# Patient Record
Sex: Male | Born: 1980 | Race: White | Hispanic: Yes | Marital: Married | Smoking: Never smoker
Health system: Southern US, Community
[De-identification: ages and names within clinical notes are randomized; demographics above are authoritative.]

## PROBLEM LIST (undated history)

## (undated) DIAGNOSIS — N5082 Scrotal pain: Secondary | ICD-10-CM

## (undated) DIAGNOSIS — K5792 Diverticulitis of intestine, part unspecified, without perforation or abscess without bleeding: Secondary | ICD-10-CM

## (undated) HISTORY — DX: Scrotal pain: N50.82

## (undated) HISTORY — DX: Diverticulitis of intestine, part unspecified, without perforation or abscess without bleeding: K57.92

## (undated) HISTORY — PX: BACK SURGERY: SHX140

---

## 2014-09-19 ENCOUNTER — Ambulatory Visit
Admission: EM | Admit: 2014-09-19 | Discharge: 2014-09-19 | Disposition: A | Discharge status: Home / Self Care | Payer: Worker's Compensation | Attending: Family Medicine | Admitting: Family Medicine

## 2014-09-19 ENCOUNTER — Ambulatory Visit: Payer: Worker's Compensation

## 2014-09-19 DIAGNOSIS — M25552 Pain in left hip: Secondary | ICD-10-CM | POA: Insufficient documentation

## 2014-09-19 MED ORDER — CYCLOBENZAPRINE HCL 5 MG PO TABS: 5.0000 mg | ORAL_TABLET | Freq: Three times a day (TID) | ORAL | Status: DC | PRN

## 2014-09-19 MED ORDER — KETOROLAC TROMETHAMINE 60 MG/2ML IM SOLN
60.0000 mg | Freq: Once | INTRAMUSCULAR | Status: AC
  Administered 2014-09-19: 60 mg via INTRAMUSCULAR

## 2014-09-19 MED ORDER — IBUPROFEN 800 MG PO TABS: 800.0000 mg | ORAL_TABLET | Freq: Three times a day (TID) | ORAL | Status: DC

## 2014-09-19 NOTE — ED Notes (Signed)
Backhoe hit left posterior upper buttock. C/o pain laterally down left leg to knee

## 2014-09-19 NOTE — ED Provider Notes (Signed)
CSN: [id number]     Arrival date & time 09/19/14  1443 History   First MD Initiated Contact with Patient 09/19/14 1713     Chief Complaint  Patient presents with  . Back Pain  . 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  Supervisor with him.  Denied bruising, swelling, cuts, incontinence, leg weakness.  Frequently changing position to find comfort e.g. Sitting, standing, shifting weight.  Most pain with weight bearing left hip and radiates to left knee.  Patient is a 34 y.o. male presenting with back pain and leg pain. The history is provided by the patient and a friend. The history is limited by a language barrier. No language interpreter was used.  Back Pain Location:  Sacro-iliac joint Quality:  Shooting Radiates to:  L knee Pain severity:  Severe Pain is:  Same all the time Onset quality:  Sudden Duration:  3 hours Timing:  Constant Progression:  Unchanged Context: occupational injury and recent injury   Context: not emotional stress, not falling, not jumping from heights, not lifting heavy objects, not MCA, not MVA, not pedestrian accident, not physical stress, not recent illness and not twisting   Relieved by:  Being still Worsened by:  Twisting, bending, ambulation, movement, standing, touching and sitting Ineffective treatments:  Being still Associated symptoms: leg pain   Associated symptoms: no abdominal pain, no abdominal swelling, no bladder incontinence, no bowel incontinence, no chest pain, no dysuria, no fever, no headaches, no numbness, no paresthesias, no pelvic pain, no perianal numbness, no tingling, no weakness and no weight loss   Risk factors: no hx of cancer, no hx of osteoporosis, no lack of exercise, no menopause, not  obese, not pregnant, no recent surgery, no steroid use and no vascular disease   Leg Pain Associated symptoms: back pain   Associated symptoms: no fatigue, no fever and no neck pain     History reviewed. No pertinent past medical history. History reviewed. No pertinent past surgical history. History reviewed. No pertinent family history. History  Substance Use Topics  . Smoking status: Never Smoker   . Smokeless tobacco: Not on file  . Alcohol Use: No    Review of Systems  Constitutional: Negative for fever, chills, weight loss, diaphoresis, activity change, appetite change and fatigue.  HENT: Negative for ear discharge, ear pain and rhinorrhea.   Eyes: Negative for photophobia, pain, discharge, redness, itching and visual disturbance.  Respiratory: Negative for cough, shortness of breath and wheezing.   Cardiovascular: Negative for chest pain, palpitations and leg swelling.  Gastrointestinal: Negative for nausea, abdominal pain, diarrhea, constipation and bowel incontinence.  Genitourinary: Negative for bladder incontinence, dysuria, difficulty urinating and pelvic pain.  Musculoskeletal: Positive for myalgias, back pain, arthralgias and gait problem. Negative for joint swelling, neck pain and neck stiffness.  Skin: Negative for color change, pallor, rash and wound.  Allergic/Immunologic: Negative for environmental allergies and food allergies.  Neurological: Negative for tingling, weakness, numbness, headaches and paresthesias.  Hematological: Negative for adenopathy. Does not bruise/bleed easily.  Psychiatric/Behavioral: Negative for behavioral problems, confusion and agitation. The patient is not nervous/anxious and is not hyperactive.     Allergies  Review of patient's allergies indicates no known allergies.  Home Medications   Prior to Admission medications   Medication Sig Start Date End Date Taking? Authorizing Provider  cyclobenzaprine (FLEXERIL) 5 MG tablet Take 1  tablet (5 mg total) by mouth 3 (three) times daily as needed for muscle spasms. 09/19/14   Barbaraann Barthelina A Raylei Losurdo, NP  ibuprofen (ADVIL,MOTRIN) 800 MG tablet Take 1 tablet (800 mg total) by mouth 3 (three) times daily. 09/19/14   Jarold Songina A Danielle Mink, NP   BP 114/71 mmHg  Pulse 68  Temp(Src) 97.2 F (36.2 C) (Tympanic)  Resp 16  Ht 5\' 5"  (1.651 m)  Wt 170 lb (77.111 kg)  BMI 28.29 kg/m2  SpO2 98% Physical Exam  Constitutional: He is oriented to person, place, and time. Vital signs are normal. He appears well-developed and well-nourished. No distress.  HENT:  Head: Normocephalic and atraumatic.  Mouth/Throat: Oropharynx is clear and moist.  Eyes: Conjunctivae, EOM and lids are normal. Pupils are equal, round, and reactive to light. Right eye exhibits no discharge. Left eye exhibits no discharge. No scleral icterus.  Neck: Trachea normal and normal range of motion. Neck supple. No JVD present. No tracheal deviation present. No thyromegaly present.  Cardiovascular: Normal rate, regular rhythm, normal heart sounds and intact distal pulses.   Pulmonary/Chest: Effort normal and breath sounds normal. No respiratory distress. He has no wheezes. He has no rales. He exhibits no tenderness.  Abdominal: Soft. He exhibits no distension. There is no tenderness. There is no rebound and no guarding.  Musculoskeletal: Normal range of motion. He exhibits tenderness. He exhibits no edema.       Left hip: He exhibits tenderness. He exhibits normal range of motion, normal strength, no bony tenderness, no swelling, no crepitus, no deformity and no laceration.       Left knee: Normal.       Lumbar back: He exhibits tenderness and pain. He exhibits normal range of motion, no bony tenderness, no swelling, no edema, no deformity, no laceration, no spasm and normal pulse.       Left upper leg: He exhibits no tenderness, no bony tenderness, no swelling, no edema, no deformity and no laceration.       Legs: Lymphadenopathy:     He has no cervical adenopathy.  Neurological: He is alert and oriented to person, place, and time. He has normal reflexes.  Skin: Skin is warm, dry and intact. No rash noted. He is not diaphoretic. No erythema. No pallor.  Psychiatric: He has a normal mood and affect. His speech is normal and behavior is normal. Judgment and thought content normal. Cognition and memory are normal.  Nursing note and vitals reviewed.   ED Course  Procedures (including critical care time) Labs Review Labs Reviewed - No data to display  Imaging Review Dg Hip Unilat With Pelvis 2-3 Views Left  09/19/2014   CLINICAL DATA:  Patient had trauma at work today with trauma to the posterior left hip causing left hip pain.  EXAM: LEFT HIP (WITH PELVIS) 2-3 VIEWS  COMPARISON:  None.  FINDINGS: There is no evidence of hip fracture or dislocation. There is minimal osteophytosis extending from the superior lateral aspect of the left acetabulum.  IMPRESSION: No acute fracture or dislocation.   Electronically Signed   By: Sherian ReinWei-Chen  Lin M.D.   On: 09/19/2014 18:03   Work note/excuse x 48 hours.  Supervisor with him at appt today.  Patient reported improvement/decrease in pain and radiation to left leg resolved from left hip after toradol.  Discussed xray results with patient and given copy of report re. Left hip osteophytes/degenerative joint disease.  Flexeril 5mg  po bedtime prn  pain.  Motrin 800mg  po TID.  Discussed with patient tomorrow will be more sore than today but if red flag symptoms e.g. Incontinence, paresthesias groin, leg weakness to go to ER for re-evaluation this weekend.  For acute pain, rest, and intermittent application of heat (do not sleep on heating pad).  I discussed longer-term treatment plan of PRN PO NSAIDS and I discussed a home back care exercise program with a strengthening and flexibility exercise.  Patient given Exitcare handout on lumbago/hip arthritis.  Proper avoidance of heavy lifting discussed.   Consider physical therapy or chiropractic care and radiology if not improving.  Call or return to clinic as needed if these symptoms worsen or fail to improve as anticipated especially leg weakness, loss of bowel/bladder control or saddle paresthesias.   Patient and supervisor verbalized understanding of instructions/information and agreed with plan of care.  P2:  Injury Prevention, fitness  MDM   1. Hip pain, acute, left   2. Pain, hip, left        Barbaraann Barthelina A Delayni Streed, NP 09/19/14 2136

## 2014-09-19 NOTE — Discharge Instructions (Signed)
Contusin (Contusion) Una contusin es un hematoma profundo. Las contusiones son el resultado de una lesin que causa sangrado debajo de la piel. La zona de la contusin puede ponerse [place], [place] o [place]. Las lesiones menores causarn contusiones sin Engineer, miningdolor, Biomedical engineerpero las ms graves pueden presentar dolor e inflamacin durante un par de semanas.  CAUSAS  Generalmente, una contusin se debe a un golpe, un traumatismo o una fuerza directa en una zona del cuerpo. SNTOMAS   Hinchazn y enrojecimiento en la zona de la lesin.  Hematomas en la zona de la lesin.  Dolor con la palpacin y sensibilidad en la zona de la lesin.  Dolor. DIAGNSTICO  Se puede establecer el diagnstico al hacer una historia clnica y un examen fsico. Nicanor Bakeal vez sea necesario hacer una radiografa, una tomografa computarizada o una resonancia magntica para determinar si hay lesiones asociadas, como fracturas. TRATAMIENTO  El tratamiento especfico depender de la zona del cuerpo donde se produjo la lesin. En general, el mejor tratamiento para una contusin es el reposo, la aplicacin de hielo, la elevacin de la zona y la aplicacin de compresas fras en la zona de la lesin. Para calmar el dolor tambin podrn recomendarle medicamentos de venta libre. Pregntele al mdico cul es el mejor tratamiento para su contusin. INSTRUCCIONES PARA EL CUIDADO EN EL HOGAR   Aplique hielo sobre la zona lesionada.  Ponga el hielo en una bolsa plstica.  Colquese una toalla entre la piel y la bolsa de hielo.  Deje el hielo durante 15 a 20minutos, 3 a 4veces por da, o segn las indicaciones del mdico.  Utilice los medicamentos de venta libre o recetados para Primary school teachercalmar el dolor, el malestar o la Comfortfiebre, segn se lo indique el mdico. El mdico podr indicarle que evite tomar antiinflamatorios (aspirina, ibuprofeno y naproxeno) durante 48 horas ya que estos medicamentos pueden aumentar los hematomas.  Mantenga la zona de la lesin  en reposo.  Si es posible, eleve la zona de la lesin para reducir la hinchazn. SOLICITE ATENCIN MDICA DE INMEDIATO SI:   El hematoma o la hinchazn aumentan.  Siente dolor que Barrytonempeora.  La hinchazn o el dolor no se OGE Energyalivian con los medicamentos. ASEGRESE DE QUE:   Comprende estas instrucciones.  Controlar su afeccin.  Recibir ayuda de inmediato si no mejora o si empeora. Document Released: 02/02/2005 Document Revised: 04/30/2013 [facility] AuthorityExitCare Patient Information 2015 [place], [place]. This information is not intended to replace advice given to you by your health care provider. Make sure you discuss any questions you have with your health care provider. Artritis inespecfica (Arthritis, Nonspecific) La artritis es la inflamacin de una articulacin. Los sntomas son dolor, enrojecimiento, calor o hinchazn. Pueden verse involucradas una o ms articulaciones. Hay diferentes tipos de artritis. El mdico no podr Museum/gallery curatordiagnosticar inmediatamente cul es el tipo de artritis que usted sufre.  CAUSAS La causa ms frecuente es el desgaste de la articulacin (osteoartritis). Esto ocasiona lesiones en el cartlago, que puede romperse con Big Beaverel tiempo. Las zonas ms afectadas por este tipo de artritis son las rodillas, caderas, espalda y cuello. Otros tipos de artritis y causas frecuentes de dolor en la articulacin son:  Esguinces y otras lesiones cercanas a la articulacin}. En algunos casos, esguinces y lesiones menores causan dolor e hinchazn que aparece horas ms tarde.  Artritis reumatoidea State Street Corporationfecta las manos, pies y rodillas. Generalmente afecta ambos lados del cuerpo al Arrow Electronicsmismo tiempo. Generalmente se asocia a enfermedades crnicas, fiebre, prdida de peso y debilidad general.  Artritis por  cristales. La gota y la pseudogota pueden causar dolor intenso agudo ocasional, enrojecimiento e hinchazn del pie, el tobillo o la rodilla.  Artritis infecciosa. Las bacterias pueden penetrar en la  articulacin a travs de una herida en la piel. Esto puede causar una infeccin en la articulacin. Las bacterias y virus tambin pueden diseminarse a travs del torrente sanguneo y Audiological scientistafectar las articulaciones.  Reacciones a medicamentos, infecciosas y Scientist, clinical (histocompatibility and immunogenetics)alrgicas. En algunos casos las articulaciones duelen levemente y estn ligeramente hinchadas en este tipo de enfermedad. SNTOMAS  El dolor es el sntoma principal.  La articulacin tambin pueden verse roja, hinchada y caliente al tacto.  En ciertos tipos de artritis hay fiebre o malestar general.  En la articulacin que presenta artritis sentir dolor con el movimiento. En otros tipos de artritis hay rigidez. DIAGNSTICO: El mdico sospechar artritis basndose en la descripcin de los sntomas y en el examen. Ser necesario realizar pruebas para diagnosticar el tipo de artritis.  Anlisis de Tajikistansangre y en algunos casos de [place].  Radiografas y en algunos casos tomografa computada o diagnstico por imgenes.  La remocin del lquido de Nurse, learning disabilityla articulacin (artrocentesis) se realiza para Scientist, physiologicalcontrolar la presencia de bacterias, cristales o por otras causas. Su mdico (o un especialista) adormecern la zona de la articulacin con un anestsico local y utilizarn una aguja para retirar lquido de la articulacin para ser examinado. Este procedimiento es slo mnimamente molesto.  An con estas pruebas, el mdico no podr decir qu tipo de artritis usted sufre. La consulta con un especialista (reumatlogo) puede ser de Montroseutilidad. TRATAMIENTO El mdico comentar con usted el tratamiento especfico para su tipo de artritis. Si el tipo especfico no puede determinarse, podrn aplicarse las siguientes recomendaciones generales.  El tratamiento para el dolor intenso de las articulaciones consiste en:  Hacer reposo  Elevar el Tuletamiembro.  Podrn prescribirle medicamentos antiinflamatorios (como ibuprofeno). Evite las actividades que aumenten Water quality scientistel  dolor.  Slo tome medicamentos de venta libre o prescriptos para Primary school teachercalmar el dolor y las [place]side Lakemolestias, segn las indicaciones de su mdico.  Puede aplicarse compresas fras sobre la articulacin dolorida durante 10 a 15 minutos cada hora. Las compresas calientes tambin pueden ser beneficiosas, pero no las utilice durante la noche. No use compresas calientes sin autorizacin de su mdico, si es diabtico.  Una inyeccin de corticoides en la articulacin artrtica puede ayudar a reducir el dolor y la hinchazn.  Si una artritis aguda The PNC Financialempeora en los siguientes 1  2 Chesterdas, ser necesario descartar una infeccin. El tratamiento prolongado implica la modificacin de Gearyactividades y del estilo de vida para reducir Charity fundraiserel estrs en la articulacin. Puede ser necesario que baje de [place] Citypeso. La actividad fsica es necesaria para nutrir Research scientist (physical sciences)el cartlago de la articulacin y Research officer, political partyeliminar los desechos. Esto ayuda a Pharmacologistmantener fuertes los msculos que rodean Nurse, learning disabilityla articulacin. INSTRUCCIONES PARA EL CUIDADO DOMICILIARIO  No tome aspirina para Engineer, materialsaliviar el dolor si se sospecha que sufre gota. Esto eleva los niveles de cido rico.  Solo tome medicamentos que se pueden comprar sin receta o recetados para Chief Technology Officerel dolor, Dentistmalestar o fiebre, como le indica el mdico.  [place] CityHaga reposo todo el tiempo que pueda.  Si la articulacin est hinchada, mantngala elevada.  Utilice muletas si la articulacin que le duele est en la pierna.  Beber abundante cantidad de lquidos ser beneficioso para ciertos tipos de artritis.  Siga las indicaciones del profesional.  La actividad fsica regular puede ser beneficiosa, incluyendo las actividades de bajo impacto como:  ErosNatacin.  Aquagym.  Andar en bicicleta.  Caminar.  La rigidez matutina se Jearld Shines con una ducha caliente.  Tambin es beneficioso que realice ejercicios de amplitud de movimiento. SOLICITE ATENCIN MDICA SI:  No se siente mejor o empeora luego de las 24 horas.  Presenta efectos  adversos por los medicamentos y no mejora con Scientist, research (medical). SOLICITE ATENCIN MDICA INMEDIATAMENTE SI:  Tiene fiebre.  Presenta fiebre o dolor intenso, hinchazn o enrojecimiento.  Muchas articulaciones estn involucradas y estn hinchadas y Electronics engineer.  Tiene un dolor intenso en la espalda o siente debilidad en las piernas.  Pierde el control de la vejiga o del intestino. Document Released: 04/25/2005 Document Revised: 07/18/2011 [facility] Patient Information 2015 [place], [place]. This information is not intended to replace advice given to you by your health care provider. Make sure you discuss any questions you have with your health care provider.

## 2016-01-27 DIAGNOSIS — N5082 Scrotal pain: Secondary | ICD-10-CM

## 2016-01-27 HISTORY — DX: Scrotal pain: N50.82

## 2016-02-22 ENCOUNTER — Encounter: Payer: Self-pay | Admitting: Emergency Medicine

## 2016-02-22 DIAGNOSIS — R109 Unspecified abdominal pain: Secondary | ICD-10-CM | POA: Insufficient documentation

## 2016-02-22 DIAGNOSIS — Z791 Long term (current) use of non-steroidal anti-inflammatories (NSAID): Secondary | ICD-10-CM | POA: Insufficient documentation

## 2016-02-22 DIAGNOSIS — F17200 Nicotine dependence, unspecified, uncomplicated: Secondary | ICD-10-CM | POA: Insufficient documentation

## 2016-02-22 DIAGNOSIS — R300 Dysuria: Secondary | ICD-10-CM | POA: Insufficient documentation

## 2016-02-22 LAB — COMPREHENSIVE METABOLIC PANEL
ALBUMIN: 4.7 g/dL (ref 3.5–5.0)
ALK PHOS: 76 U/L (ref 38–126)
ALT: 23 U/L (ref 17–63)
AST: 20 U/L (ref 15–41)
Anion gap: 7 (ref 5–15)
BUN: 12 mg/dL (ref 6–20)
CALCIUM: 9.4 mg/dL (ref 8.9–10.3)
CHLORIDE: 105 mmol/L (ref 101–111)
CO2: 27 mmol/L (ref 22–32)
Creatinine, Ser: 0.75 mg/dL (ref 0.61–1.24)
GFR calc non Af Amer: >60 mL/min (ref >60)
GLUCOSE: 126 mg/dL — AB (ref 65–99)
Potassium: 4.1 mmol/L (ref 3.5–5.1)
SODIUM: 139 mmol/L (ref 135–145)
Total Bilirubin: 0.5 mg/dL (ref 0.3–1.2)
Total Protein: 7.9 g/dL (ref 6.5–8.1)

## 2016-02-22 LAB — URINALYSIS COMPLETE WITH MICROSCOPIC (ARMC ONLY)
Bacteria, UA: NONE SEEN
Bilirubin Urine: NEGATIVE
Glucose, UA: NEGATIVE mg/dL
Hgb urine dipstick: NEGATIVE
Ketones, ur: NEGATIVE mg/dL
Leukocytes, UA: NEGATIVE
Nitrite: NEGATIVE
PROTEIN: NEGATIVE mg/dL
SQUAMOUS EPITHELIAL / LPF: NONE SEEN
Specific Gravity, Urine: 1.024 (ref 1.005–1.030)
WBC, UA: NONE SEEN WBC/hpf (ref 0–5)
pH: 6 (ref 5.0–8.0)

## 2016-02-22 LAB — CBC
HCT: 46.8 % (ref 40.0–52.0)
Hemoglobin: 16.3 g/dL (ref 13.0–18.0)
MCH: 30.2 pg (ref 26.0–34.0)
MCHC: 34.9 g/dL (ref 32.0–36.0)
MCV: 86.5 fL (ref 80.0–100.0)
PLATELETS: 198 10*3/uL (ref 150–440)
RBC: 5.41 MIL/uL (ref 4.40–5.90)
RDW: 13.6 % (ref 11.5–14.5)
WBC: 8.3 10*3/uL (ref 3.8–10.6)

## 2016-02-22 LAB — LIPASE, BLOOD: LIPASE: 29 U/L (ref 11–51)

## 2016-02-22 NOTE — ED Triage Notes (Signed)
Pt ambulatory to triage with steady gait with c/o left flank pain radiating to right abd pain x 3 days. Pt reports dysuria as well. Pt states has an appt with urology for a CT scan of abdomen and pelvis but reports pain has been increasing.

## 2016-02-23 ENCOUNTER — Emergency Department
Admission: EM | Admit: 2016-02-23 | Discharge: 2016-02-23 | Disposition: A | Discharge status: Home / Self Care | Payer: Self-pay | Attending: Emergency Medicine | Admitting: Emergency Medicine

## 2016-02-23 ENCOUNTER — Emergency Department: Payer: Self-pay

## 2016-02-23 ENCOUNTER — Encounter: Payer: Self-pay | Admitting: Radiology

## 2016-02-23 DIAGNOSIS — R300 Dysuria: Secondary | ICD-10-CM

## 2016-02-23 LAB — CHLAMYDIA/NGC RT PCR (ARMC ONLY)
Chlamydia Tr: NOT DETECTED
N GONORRHOEAE: NOT DETECTED

## 2016-02-23 MED ORDER — IOPAMIDOL (ISOVUE-300) INJECTION 61%
30.0000 mL | Freq: Once | INTRAVENOUS | Status: AC | PRN
  Administered 2016-02-23: 30 mL via ORAL

## 2016-02-23 MED ORDER — IOPAMIDOL (ISOVUE-300) INJECTION 61%
100.0000 mL | Freq: Once | INTRAVENOUS | Status: AC | PRN
  Administered 2016-02-23: 100 mL via INTRAVENOUS

## 2016-02-23 NOTE — ED Provider Notes (Signed)
[facility] Emergency Department Provider Note   First MD Initiated Contact with Patient 02/23/16 0142     (approximate)  I have reviewed the triage vital signs and the nursing notes.   HISTORY  Chief Complaint Flank Pain   HPI Gerald Chavez is a 35 y.o. male presents with three-day history of left flank pain and dysuria patient states that he has an appointment scheduled with urology to have a CT scan of the abdomen and pelvis performed however given increasing discomfort he presented to emergency department tonight. Patient denies any fever afebrile on presentation with a temperature 97.5. Patient denies any hematuria. Patient denies any nausea vomiting or diarrhea  Past medical history None There are no active problems to display for this patient.   Past Surgical History:  Procedure Laterality Date  . BACK SURGERY      Prior to Admission medications   Medication Sig Start Date End Date Taking? Authorizing Provider  cyclobenzaprine (FLEXERIL) 5 MG tablet Take 1 tablet (5 mg total) by mouth 3 (three) times daily as needed for muscle spasms. 09/19/14   Barbaraann Barthelina A Betancourt, NP  ibuprofen (ADVIL,MOTRIN) 800 MG tablet Take 1 tablet (800 mg total) by mouth 3 (three) times daily. 09/19/14   Barbaraann Barthelina A Betancourt, NP    Allergies No known drug allergies No family history on file.  Social History Social History  Substance Use Topics  . Smoking status: Current Some Day Smoker  . Smokeless tobacco: Never Used  . Alcohol use No    Review of Systems Constitutional: No fever/chills Eyes: No visual changes. ENT: No sore throat. Cardiovascular: Denies chest pain. Respiratory: Denies shortness of breath. Gastrointestinal: No abdominal pain.  No nausea, no vomiting.  No diarrhea.  No constipation. Genitourinary: Positive for dysuria. Positive for flank pain Musculoskeletal: Negative for back pain. Skin: Negative for rash. Neurological: Negative for  headaches, focal weakness or numbness.  10-point ROS otherwise negative.  ____________________________________________   PHYSICAL EXAM:  VITAL SIGNS: ED Triage Vitals  Enc Vitals Group     BP 02/22/16 2216 (!) 142/89     Pulse Rate 02/22/16 2216 65     Resp 02/22/16 2216 18     Temp 02/22/16 2216 97.5 F (36.4 C)     Temp Source 02/22/16 2216 Oral     SpO2 02/22/16 2216 100 %     Weight 02/22/16 2218 165 lb (74.8 kg)     Height 02/22/16 2218 5\' 6"  (1.676 m)     Head Circumference --      Peak Flow --      Pain Score 02/22/16 2218 6     Pain Loc --      Pain Edu? --      Excl. in GC? --     Constitutional: Alert and oriented. Well appearing and in no acute distress. Eyes: Conjunctivae are normal. PERRL. EOMI. Head: Atraumatic. Mouth/Throat: Mucous membranes are moist.  Oropharynx non-erythematous. Neck: No stridor.  No meningeal signs.   Cardiovascular: Normal rate, regular rhythm. Good peripheral circulation. Grossly normal heart sounds. Respiratory: Normal respiratory effort.  No retractions. Lungs CTAB. Gastrointestinal: Soft and nontender. No distention.  Musculoskeletal: No lower extremity tenderness nor edema. No gross deformities of extremities. Neurologic:  Normal speech and language. No gross focal neurologic deficits are appreciated.  Skin:  Skin is warm, dry and intact. No rash noted. Psychiatric: Mood and affect are normal. Speech and behavior are normal.  ____________________________________________   LABS (all labs ordered  are listed, but only abnormal results are displayed)  Labs Reviewed  COMPREHENSIVE METABOLIC PANEL - Abnormal; Notable for the following:       Result Value   Glucose, Bld 126 (*)    All other components within normal limits  URINALYSIS COMPLETEWITH MICROSCOPIC ([facility] ONLY) - Abnormal; Notable for the following:    Color, Urine YELLOW (*)    APPearance CLEAR (*)    All other components within normal limits  LIPASE, BLOOD  CBC     I, Olivette N Corneshia Hines, personally viewed and evaluated these images (plain radiographs) as part of my medical decision making, as well as reviewing the written report by the radiologist.  Ct Abdomen Pelvis W Contrast  Result Date: 02/23/2016 CLINICAL DATA:  Complaining of left flank pain radiating to the right hand MMM 3 days. Test area abdominal. Overall O EXAM: CT ABDOMEN AND PELVIS WITH CONTRAST TECHNIQUE: Multidetector CT imaging of the abdomen and pelvis was performed using the standard protocol following bolus administration of intravenous contrast. CONTRAST:  ISOVUE-300 IOPAMIDOL (ISOVUE-300) INJECTION 61% COMPARISON:  None. FINDINGS: Lower chest: No acute abnormality. Hepatobiliary: No focal liver abnormality is seen. No gallstones, gallbladder wall thickening, or biliary dilatation. Pancreas: Unremarkable. No pancreatic ductal dilatation or surrounding inflammatory changes. Spleen: Normal in size without focal abnormality. Adrenals/Urinary Tract: 3 mm interpolar renal cyst on the right too small to further characterize. No obstructive uropathy. Bladder is physiologic in appearance without significant distention or calculus. Stomach/Bowel: Stomach is within normal limits. Appendix appears normal. No evidence of bowel wall thickening, distention, or inflammatory changes. Moderate fecal residue throughout large bowel. Vascular/Lymphatic: No significant vascular findings are present. No enlarged abdominal or pelvic lymph nodes. Reproductive: Prostate is unremarkable. Other: No abdominal wall hernia or abnormality. No abdominopelvic ascites. Musculoskeletal: Mild disc bulge L4-5. Slight disc space narrowing at L5-S1. No acute osseous abnormality. IMPRESSION: No acute intra-abdominal nor pelvic finding. Mild disc bulge L4-5. Electronically Signed   By: Tollie Eth M.D.   On: 02/23/2016 03:09    ___ Procedures     INITIAL IMPRESSION / ASSESSMENT AND PLAN / ED COURSE  Pertinent labs &  imaging results that were available during my care of the patient were reviewed by me and considered in my medical decision making (see chart for details).  No clear etiology identified for the patient's right flank pain and dysuria. CT scan revealed no acute intra-abdominal nor pelvic process. As such patient will be referred to urology for further outpatient evaluation   Clinical Course    ____________________________________________  FINAL CLINICAL IMPRESSION(S) / ED DIAGNOSES  Final diagnoses:  Dysuria  Right flank pain   MEDICATIONS GIVEN DURING THIS VISIT:  Medications  iopamidol (ISOVUE-300) 61 % injection 30 mL (30 mLs Oral Contrast Given 02/23/16 0207)  iopamidol (ISOVUE-300) 61 % injection 100 mL (100 mLs Intravenous Contrast Given 02/23/16 0229)     NEW OUTPATIENT MEDICATIONS STARTED DURING THIS VISIT:  New Prescriptions   No medications on file    Modified Medications   No medications on file    Discontinued Medications   No medications on file     Note:  This document was prepared using Dragon voice recognition software and may include unintentional dictation errors.    Darci Current, MD 02/25/16 [phone]

## 2016-02-23 NOTE — ED Notes (Signed)
Pt. Verbalizes understanding of d/c instructions and follow-up. VS stable and pain controlled per pt.  Pt. In NAD at time of d/c and denies further concerns regarding this visit. Pt. Stable at the time of departure from the unit, departing unit by the safest and most appropriate manner per that pt condition and limitations. Pt advised to return to the ED at any time for emergent concerns, or for new/worsening symptoms.   

## 2016-02-23 NOTE — ED Notes (Signed)
Interpreter on a stick at bedside.

## 2016-02-24 LAB — URINE CULTURE: Culture: NO GROWTH

## 2016-04-17 DIAGNOSIS — G8929 Other chronic pain: Secondary | ICD-10-CM | POA: Insufficient documentation

## 2016-04-17 DIAGNOSIS — R102 Pelvic and perineal pain unspecified side: Secondary | ICD-10-CM | POA: Insufficient documentation

## 2017-07-01 ENCOUNTER — Inpatient Hospital Stay
Admission: EM | Admit: 2017-07-01 | Discharge: 2017-07-04 | DRG: 392 | Disposition: A | Discharge status: Home / Self Care | Payer: Medicaid Other | Attending: Surgery | Admitting: Surgery

## 2017-07-01 ENCOUNTER — Other Ambulatory Visit: Payer: Self-pay

## 2017-07-01 ENCOUNTER — Encounter: Payer: Self-pay | Admitting: Emergency Medicine

## 2017-07-01 ENCOUNTER — Emergency Department: Payer: Medicaid Other

## 2017-07-01 DIAGNOSIS — K5792 Diverticulitis of intestine, part unspecified, without perforation or abscess without bleeding: Secondary | ICD-10-CM

## 2017-07-01 DIAGNOSIS — Z79899 Other long term (current) drug therapy: Secondary | ICD-10-CM

## 2017-07-01 DIAGNOSIS — F17200 Nicotine dependence, unspecified, uncomplicated: Secondary | ICD-10-CM | POA: Diagnosis present

## 2017-07-01 DIAGNOSIS — K5732 Diverticulitis of large intestine without perforation or abscess without bleeding: Principal | ICD-10-CM | POA: Diagnosis present

## 2017-07-01 HISTORY — DX: Diverticulitis of intestine, part unspecified, without perforation or abscess without bleeding: K57.92

## 2017-07-01 LAB — URINALYSIS, COMPLETE (UACMP) WITH MICROSCOPIC
Bacteria, UA: NONE SEEN
Bilirubin Urine: NEGATIVE
Glucose, UA: NEGATIVE mg/dL
Ketones, ur: NEGATIVE mg/dL
Leukocytes, UA: NEGATIVE
Nitrite: NEGATIVE
PH: 6 (ref 5.0–8.0)
Protein, ur: NEGATIVE mg/dL
SPECIFIC GRAVITY, URINE: 1.024 (ref 1.005–1.030)
SQUAMOUS EPITHELIAL / LPF: NONE SEEN

## 2017-07-01 LAB — CBC
HCT: 46.3 % (ref 40.0–52.0)
HCT: 44.1 % (ref 40.0–52.0)
Hemoglobin: 15.7 g/dL (ref 13.0–18.0)
Hemoglobin: 14.8 g/dL (ref 13.0–18.0)
MCH: 30 pg (ref 26.0–34.0)
MCH: 29.8 pg (ref 26.0–34.0)
MCHC: 33.9 g/dL (ref 32.0–36.0)
MCHC: 33.6 g/dL (ref 32.0–36.0)
MCV: 88.7 fL (ref 80.0–100.0)
MCV: 88.9 fL (ref 80.0–100.0)
PLATELETS: 196 10*3/uL (ref 150–440)
Platelets: 197 10*3/uL (ref 150–440)
RBC: 5.22 MIL/uL (ref 4.40–5.90)
RBC: 4.96 MIL/uL (ref 4.40–5.90)
RDW: 13.2 % (ref 11.5–14.5)
RDW: 13.5 % (ref 11.5–14.5)
WBC: 14.9 10*3/uL — ABNORMAL HIGH (ref 3.8–10.6)
WBC: 15.9 10*3/uL — AB (ref 3.8–10.6)

## 2017-07-01 LAB — COMPREHENSIVE METABOLIC PANEL
ALT: 31 U/L (ref 17–63)
AST: 28 U/L (ref 15–41)
Albumin: 4.3 g/dL (ref 3.5–5.0)
Alkaline Phosphatase: 50 U/L (ref 38–126)
Anion gap: 7 (ref 5–15)
BUN: 15 mg/dL (ref 6–20)
CHLORIDE: 103 mmol/L (ref 101–111)
CO2: 27 mmol/L (ref 22–32)
Calcium: 8.9 mg/dL (ref 8.9–10.3)
Creatinine, Ser: 0.98 mg/dL (ref 0.61–1.24)
GFR calc Af Amer: >60 mL/min (ref >60)
GFR calc non Af Amer: >60 mL/min (ref >60)
Glucose, Bld: 97 mg/dL (ref 65–99)
Potassium: 3.2 mmol/L — ABNORMAL LOW (ref 3.5–5.1)
Sodium: 137 mmol/L (ref 135–145)
Total Bilirubin: 1.6 mg/dL — ABNORMAL HIGH (ref 0.3–1.2)
Total Protein: 7.8 g/dL (ref 6.5–8.1)

## 2017-07-01 LAB — CREATININE, SERUM
CREATININE: 0.79 mg/dL (ref 0.61–1.24)
GFR calc non Af Amer: >60 mL/min (ref >60)

## 2017-07-01 LAB — LIPASE, BLOOD: LIPASE: 23 U/L (ref 11–51)

## 2017-07-01 MED ORDER — HEPARIN SODIUM (PORCINE) 5000 UNIT/ML IJ SOLN
5000.0000 [IU] | Freq: Three times a day (TID) | INTRAMUSCULAR | Status: DC
  Administered 2017-07-01 – 2017-07-04 (×8): 5000 [IU] via SUBCUTANEOUS
  Filled 2017-07-01 (×8): qty 1

## 2017-07-01 MED ORDER — CEFTRIAXONE SODIUM 250 MG IJ SOLR
250.0000 mg | Freq: Once | INTRAMUSCULAR | Status: DC
  Filled 2017-07-01: qty 250

## 2017-07-01 MED ORDER — DEXTROSE IN LACTATED RINGERS 5 % IV SOLN
INTRAVENOUS | Status: DC
  Administered 2017-07-01 – 2017-07-03 (×6): via INTRAVENOUS
  Filled 2017-07-01 (×3): qty 1000

## 2017-07-01 MED ORDER — ONDANSETRON HCL 4 MG PO TABS: 4.0000 mg | ORAL_TABLET | Freq: Four times a day (QID) | ORAL | Status: DC | PRN

## 2017-07-01 MED ORDER — KETOROLAC TROMETHAMINE 30 MG/ML IJ SOLN
30.0000 mg | Freq: Once | INTRAMUSCULAR | Status: AC
  Administered 2017-07-01: 30 mg via INTRAVENOUS
  Filled 2017-07-01: qty 1

## 2017-07-01 MED ORDER — PIPERACILLIN-TAZOBACTAM 3.375 G IVPB
3.3750 g | Freq: Three times a day (TID) | INTRAVENOUS | Status: DC
Start: 2017-07-01 — End: 2017-07-03
  Administered 2017-07-01 – 2017-07-03 (×7): 3.375 g via INTRAVENOUS
  Filled 2017-07-01 (×7): qty 50

## 2017-07-01 MED ORDER — MORPHINE SULFATE (PF) 4 MG/ML IV SOLN
4.0000 mg | Freq: Once | INTRAVENOUS | Status: AC
  Administered 2017-07-01: 4 mg via INTRAVENOUS
  Filled 2017-07-01: qty 1

## 2017-07-01 MED ORDER — IOPAMIDOL (ISOVUE-300) INJECTION 61%
100.0000 mL | Freq: Once | INTRAVENOUS | Status: AC | PRN
  Administered 2017-07-01: 100 mL via INTRAVENOUS

## 2017-07-01 MED ORDER — MORPHINE SULFATE (PF) 2 MG/ML IV SOLN
2.0000 mg | INTRAVENOUS | Status: DC | PRN
  Administered 2017-07-01 – 2017-07-02 (×6): 2 mg via INTRAVENOUS
  Filled 2017-07-01 (×6): qty 1

## 2017-07-01 MED ORDER — CIPROFLOXACIN IN D5W 400 MG/200ML IV SOLN
400.0000 mg | Freq: Once | INTRAVENOUS | Status: AC
  Administered 2017-07-01: 400 mg via INTRAVENOUS
  Filled 2017-07-01: qty 200

## 2017-07-01 MED ORDER — METRONIDAZOLE 500 MG PO TABS: 500.0000 mg | ORAL_TABLET | Freq: Once | ORAL | Status: DC

## 2017-07-01 MED ORDER — CIPROFLOXACIN HCL 500 MG PO TABS: 500.0000 mg | ORAL_TABLET | Freq: Once | ORAL | Status: DC

## 2017-07-01 MED ORDER — ONDANSETRON HCL 4 MG/2ML IJ SOLN: 4.0000 mg | Freq: Four times a day (QID) | INTRAMUSCULAR | Status: DC | PRN

## 2017-07-01 MED ORDER — HYDROCODONE-ACETAMINOPHEN 5-325 MG PO TABS
1.0000 | ORAL_TABLET | ORAL | Status: DC | PRN
  Administered 2017-07-02: 2 via ORAL
  Administered 2017-07-02: 1 via ORAL
  Administered 2017-07-02: 2 via ORAL
  Filled 2017-07-01 (×2): qty 1
  Filled 2017-07-01: qty 2
  Filled 2017-07-01: qty 1

## 2017-07-01 MED ORDER — METRONIDAZOLE IN NACL 5-0.79 MG/ML-% IV SOLN
500.0000 mg | Freq: Once | INTRAVENOUS | Status: AC
  Administered 2017-07-01: 500 mg via INTRAVENOUS
  Filled 2017-07-01: qty 100

## 2017-07-01 MED ORDER — ONDANSETRON HCL 4 MG/2ML IJ SOLN
4.0000 mg | Freq: Once | INTRAMUSCULAR | Status: AC
  Administered 2017-07-01: 4 mg via INTRAVENOUS
  Filled 2017-07-01: qty 2

## 2017-07-01 NOTE — ED Notes (Addendum)
Patient reports Patient reports pain with urination, has had this issue for about 2 years and providers have not been able to find a cause.  States his urine has foul odor and is dark in color, he also reports the pain is mid lower abdomen and feels like it starts in his bladder.  Patient also reports left testicle pain.  Patient reports frequent urination of small amounts with pain.

## 2017-07-01 NOTE — H&P (Signed)
Gerald Chavez is an 37 y.o. male.    Chief Complaint: Left lower quadrant pain  HPI: This patient with 3 days of abdominal pain he points to the left lower quadrant.  He has had no nausea vomiting may have had a fever.  He has had a prior history of chronic prostatitis.  This pain is different than his prostate problem.  He has had no abdominal surgery.  History reviewed. No pertinent past medical history.  Past Surgical History:  Procedure Laterality Date  . BACK SURGERY      No family history on file.  No family history of serious medical problems Social History:  reports that he has been smoking.  he has never used smokeless tobacco. He reports that he does not drink alcohol. His drug history is not on file. Patient works in [employer]. Allergies: No Known Allergies   (Not in a hospital admission)   Review of Systems  Constitutional: Positive for fever. Negative for chills, malaise/fatigue and weight loss.  HENT: Negative.   Eyes: Negative.   Respiratory: Negative.   Cardiovascular: Negative.   Gastrointestinal: Positive for abdominal pain. Negative for blood in stool, constipation, diarrhea, heartburn, nausea and vomiting.  Genitourinary: Negative.   Musculoskeletal: Negative.   Skin: Negative.   Neurological: Negative.   Endo/Heme/Allergies: Negative.   Psychiatric/Behavioral: Negative.      Physical Exam:  BP 109/68 (BP Location: Right Arm)   Pulse 86   Temp 98.4 F (36.9 C) (Oral)   Resp 18   Ht 5' 6"  (1.676 m)   Wt 165 lb (74.8 kg)   SpO2 100%   BMI 26.63 kg/m   Physical Exam  Constitutional: He is oriented to person, place, and time and well-developed, well-nourished, and in no distress. No distress.  HENT:  Head: Normocephalic and atraumatic.  Eyes: Pupils are equal, round, and reactive to light. Right eye exhibits no discharge. Left eye exhibits no discharge. No scleral icterus.  Neck: Normal range of motion.  Cardiovascular: Normal rate,  regular rhythm and normal heart sounds.  Pulmonary/Chest: Effort normal and breath sounds normal. No respiratory distress. He has no wheezes. He has no rales.  Abdominal: Soft. He exhibits no distension. There is tenderness. There is guarding. There is no rebound.  Tenderness in the left lower quadrant with some guarding but no rebound or percussion tenderness  Musculoskeletal: Normal range of motion. He exhibits no edema or tenderness.  Lymphadenopathy:    He has no cervical adenopathy.  Neurological: He is alert and oriented to person, place, and time.  Skin: Skin is warm and dry. No rash noted. He is not diaphoretic. No erythema.  Vitals reviewed.       Results for orders placed or performed during the hospital encounter of 07/01/17 (from the past 48 hour(s))  Lipase, blood     Status: None   Collection Time: 07/01/17  7:25 AM  Result Value Ref Range   Lipase 23 11 - 51 U/L    Comment: Performed at [facility], [address] [place], [place] [address]  Comprehensive metabolic panel     Status: Abnormal   Collection Time: 07/01/17  7:25 AM  Result Value Ref Range   Sodium 137 135 - 145 mmol/L   Potassium 3.2 (L) 3.5 - 5.1 mmol/L   Chloride 103 101 - 111 mmol/L   CO2 27 22 - 32 mmol/L   Glucose, Bld 97 65 - 99 mg/dL   BUN 15 6 - 20 mg/dL  Creatinine, Ser 0.98 0.61 - 1.24 mg/dL   Calcium 8.9 8.9 - 10.3 mg/dL   Total Protein 7.8 6.5 - 8.1 g/dL   Albumin 4.3 3.5 - 5.0 g/dL   AST 28 15 - 41 U/L   ALT 31 17 - 63 U/L   Alkaline Phosphatase 50 38 - 126 U/L   Total Bilirubin 1.6 (H) 0.3 - 1.2 mg/dL   GFR calc non Af Amer >60 >60 mL/min   GFR calc Af Amer >60 >60 mL/min    Comment: (NOTE) The eGFR has been calculated using the CKD EPI equation. This calculation has not been validated in all clinical situations. eGFR's persistently <60 mL/min signify possible Chronic Kidney Disease.    Anion gap 7 5 - 15    Comment: Performed at [facility], [address] [place], [place] [address]  CBC     Status: Abnormal   Collection Time: 07/01/17  7:25 AM  Result Value Ref Range   WBC 14.9 (H) 3.8 - 10.6 K/uL   RBC 5.22 4.40 - 5.90 MIL/uL   Hemoglobin 15.7 13.0 - 18.0 g/dL   HCT 46.3 40.0 - 52.0 %   MCV 88.7 80.0 - 100.0 fL   MCH 30.0 26.0 - 34.0 pg   MCHC 33.9 32.0 - 36.0 g/dL   RDW 13.2 11.5 - 14.5 %   Platelets 197 150 - 440 K/uL    Comment: Performed at [facility], [address] [place], [place] [address]  Urinalysis, Complete w Microscopic     Status: Abnormal   Collection Time: 07/01/17  7:25 AM  Result Value Ref Range   Color, Urine YELLOW (A) YELLOW   APPearance CLEAR (A) CLEAR   Specific Gravity, Urine 1.024 1.005 - 1.030   pH 6.0 5.0 - 8.0   Glucose, UA NEGATIVE NEGATIVE mg/dL   Hgb urine dipstick SMALL (A) NEGATIVE   Bilirubin Urine NEGATIVE NEGATIVE   Ketones, ur NEGATIVE NEGATIVE mg/dL   Protein, ur NEGATIVE NEGATIVE mg/dL   Nitrite NEGATIVE NEGATIVE   Leukocytes, UA NEGATIVE NEGATIVE   RBC / HPF 0-5 0 - 5 RBC/hpf   WBC, UA 0-5 0 - 5 WBC/hpf   Bacteria, UA NONE SEEN NONE SEEN   Squamous Epithelial / LPF NONE SEEN NONE SEEN   Mucus PRESENT     Comment: Performed at [facility], [address] [place], [place] [address]   Ct Abdomen Pelvis W Contrast  Result Date: 07/01/2017 CLINICAL DATA:  Chronic dysuria. Malodorous and dark colored urine. Pelvic and groin pain. EXAM: CT ABDOMEN AND PELVIS WITH CONTRAST TECHNIQUE: Multidetector CT imaging of the abdomen and pelvis was performed using the standard protocol following bolus administration of intravenous contrast. CONTRAST:  130m ISOVUE-300 IOPAMIDOL (ISOVUE-300) INJECTION 61% COMPARISON:  02/23/2016 FINDINGS: Lower chest: Unremarkable Hepatobiliary: Unremarkable Pancreas: Unremarkable Spleen: Unremarkable Adrenals/Urinary Tract: 0.8 by 0.4 cm hypodense lesion of the right mid kidney previously measured 0.6 by 0.3 cm by my measurements. This lesion is  technically too small to characterize. The kidneys appear otherwise normal. The adrenal glands appear normal. Stomach/Bowel: There is acute diverticulitis involving the sigmoid colon with associated wall thickening, inflamed diverticula, and mesenteric edema. There is potentially a small micro perforation in this vicinity for example on images 64-65 of series 2 where there is gas density not clearly surrounded by a diverticular wall. Sometimes this distinction can be difficult on CT. No drainable abscess, this gas collection is only about 8 mm in diameter and appears locally  contained. There is no gas freely distributed in the abdomen. Vascular/Lymphatic: Small retroperitoneal lymph nodes do not appear pathologically enlarged by size criteria. Reproductive: Unremarkable Other: No supplemental non-categorized findings. Musculoskeletal: Mild degenerative disc disease in the lower lumbar spine likely causing right foraminal impingement at L5-S1. IMPRESSION: 1. Acute moderate sigmoid diverticulitis. There is a small amount of gas along the margin of the inflamed sigmoid colon which could be a contained small micro perforation but could alternatively be contained in a diverticulum. No free air is observed. No drainable abscess. I do not observe a fistula to the bladder. 2. Right foraminal impingement at L5-S1 due to degenerative disc disease. 3. Small hypodense lesion in the right mid kidney is technically too small to characterize, although statistically likely to be benign. Electronically Signed   By: Van Clines M.D.   On: 07/01/2017 09:47     Assessment/Plan  Elevated white blood cell count.  CT scan is personally reviewed showing probable diverticulitis.  This is consistent with the patient's history of left lower quadrant pain over 3 days.  There is a question whether or not he has had fever.  I suggested admission to the hospital and IV antibiotics.  The rationale for this was discussed.  He  understands this but has also requested a in an interpreter which will be obtained and discussed with him.  Florene Glen, MD, FACS

## 2017-07-01 NOTE — ED Triage Notes (Signed)
Lower abd pain and difficulty urinating x 2 days. Fever yesterday.

## 2017-07-01 NOTE — ED Provider Notes (Signed)
[facility] Emergency Department Provider Note   ____________________________________________   First MD Initiated Contact with Patient 07/01/17 0831     (approximate)  I have reviewed the triage vital signs and the nursing notes.   HISTORY  Chief Complaint Abdominal Pain    HPI Gerald Chavez is a 37 y.o. male presents for evaluation of lower abdominal pain  Patient reports that for about the last 2 years had chronic pain in the lower abdomen, but the last 2 days he has significant increase in pain.  Reports in the same area and induces a burning discomfort with urination and also reports he felt like he had a fever yesterday.  His urine seems slightly darker today  No nausea or vomiting.  Does have a history of prostatitis.  Reports chronic prostatitis, follow-up with urology, reports is been treated with antibiotics as well as other medicine she cannot quite recall the name of in the past without much relief, lives with a daily but since significantly worse last 2 days.  Does remain sexually active.  No pain in his testicles or scrotum.  No penile lesions.  No blood in his stool.  It is painful to defecate.   History reviewed. No pertinent past medical history.  There are no active problems to display for this patient.   Past Surgical History:  Procedure Laterality Date  . BACK SURGERY      Prior to Admission medications   Medication Sig Start Date End Date Taking? Authorizing Provider  cyclobenzaprine (FLEXERIL) 5 MG tablet Take 1 tablet (5 mg total) by mouth 3 (three) times daily as needed for muscle spasms. Patient not taking: Reported on 07/01/2017 09/19/14   Albina BilletBetancourt, Tina A, NP  ibuprofen (ADVIL,MOTRIN) 800 MG tablet Take 1 tablet (800 mg total) by mouth 3 (three) times daily. Patient not taking: Reported on 07/01/2017 09/19/14   Barbaraann BarthelBetancourt, Tina A, NP    Allergies Patient has no known allergies.  No family history on  file.  Social History Social History   Tobacco Use  . Smoking status: Current Some Day Smoker  . Smokeless tobacco: Never Used  Substance Use Topics  . Alcohol use: No  . Drug use: Not on file    Review of Systems Constitutional: No fever/chills Eyes: No visual changes. ENT: No sore throat. Cardiovascular: Denies chest pain. Respiratory: Denies shortness of breath. Gastrointestinal:  No nausea, no vomiting.  No diarrhea.  No constipation. Genitourinary: See HPI.  Some dysuria. Musculoskeletal: Negative for back pain. Skin: Negative for rash. Neurological: Negative for headaches, focal weakness or numbness.    ____________________________________________   PHYSICAL EXAM:  VITAL SIGNS: ED Triage Vitals  Enc Vitals Group     BP 07/01/17 0715 129/81     Pulse Rate 07/01/17 0715 80     Resp 07/01/17 0715 18     Temp 07/01/17 0715 98 F (36.7 C)     Temp Source 07/01/17 0715 Oral     SpO2 07/01/17 0715 100 %     Weight 07/01/17 0718 165 lb (74.8 kg)     Height 07/01/17 0718 5\' 6"  (1.676 m)     Head Circumference --      Peak Flow --      Pain Score 07/01/17 0718 8     Pain Loc --      Pain Edu? --      Excl. in GC? --     Constitutional: Alert and oriented. Well appearing and in no  acute distress. Eyes: Conjunctivae are normal. Head: Atraumatic. Nose: No congestion/rhinnorhea. Mouth/Throat: Mucous membranes are moist. Neck: No stridor.   Cardiovascular: Normal rate, regular rhythm. Grossly normal heart sounds.  Good peripheral circulation. Respiratory: Normal respiratory effort.  No retractions. Lungs CTAB. Gastrointestinal: Soft and relatively tender across the suprapubic and left lower quadrant without frank peritonitis or rebound tenderness.  On rectal examination the patient reports notable tenderness to palpation along the prostate that seems to be mild however.  Brown stool without bleeding did noted.. No distention. Musculoskeletal: No lower extremity  tenderness nor edema. Neurologic:  Normal speech and language. No gross focal neurologic deficits are appreciated.  Skin:  Skin is warm, dry and intact. No rash noted. Psychiatric: Mood and affect are normal. Speech and behavior are normal.  ____________________________________________   LABS (all labs ordered are listed, but only abnormal results are displayed)  Labs Reviewed  COMPREHENSIVE METABOLIC PANEL - Abnormal; Notable for the following components:      Result Value   Potassium 3.2 (*)    Total Bilirubin 1.6 (*)    All other components within normal limits  CBC - Abnormal; Notable for the following components:   WBC 14.9 (*)    All other components within normal limits  URINALYSIS, COMPLETE (UACMP) WITH MICROSCOPIC - Abnormal; Notable for the following components:   Color, Urine YELLOW (*)    APPearance CLEAR (*)    Hgb urine dipstick SMALL (*)    All other components within normal limits  URINE CULTURE  LIPASE, BLOOD   ____________________________________________  EKG   ____________________________________________  RADIOLOGY  Reviewed the abdominal CT scan reported as possible microperforated diverticulitis versus a possible diverticulum. ____________________________________________   PROCEDURES  Procedure(s) performed: None  Procedures  Critical Care performed: No  ____________________________________________   INITIAL IMPRESSION / ASSESSMENT AND PLAN / ED COURSE  Pertinent labs & imaging results that were available during my care of the patient were reviewed by me and considered in my medical decision making (see chart for details).  Patient presents for evaluation of abdominal pain.  Reports a feeling of discomfort in his lower abdomen, hard to describe seems sore like his chronic prostatitis but is worse.  Does report a fever yesterday.  On clinical examination fairly tender across the lower abdomen, especially worse in the left lower.     ----------------------------------------- 10:42 AM on 07/01/2017 -----------------------------------------  Reviewed CT scan with gastroenterology.  GI recommends admission for surgery consultation and close observation and IV antibiotics including Cipro and Flagyl.  I have initiated these, I have also at this time spoke with Dr. Excell Seltzer who is planning to see the patient for admission and further evaluation for what appears to be likely acute sigmoid diverticulitis possibly with microperforation, though some uncertainty does exist regarding the radiology report of a possible diverticulum versus microperforation.  Updated patient Via Spanish interpreter.  Pain slightly better with Toradol, will trial morphine and Zofran at this time.  He is awake and alert.  Patient and his sister at the bedside are both agreeable and understanding of plan for admission and further evaluation.  ____________________________________________   FINAL CLINICAL IMPRESSION(S) / ED DIAGNOSES  Final diagnoses:  Acute diverticulitis of intestine      NEW MEDICATIONS STARTED DURING THIS VISIT:  New Prescriptions   No medications on file     Note:  This document was prepared using Dragon voice recognition software and may include unintentional dictation errors.     Sharyn Creamer, MD 07/01/17 1043

## 2017-07-01 NOTE — ED Notes (Signed)
Report received from SeboyetaJay, RN, pt states pain has decreased but remains present, awaiting consult and pt to be admitted.

## 2017-07-01 NOTE — Consult Note (Addendum)
Gerald Antigua, MD [address], [place], [place], [place], [address] [address], [place], [place], [place], [address] Phone: [phone]  Fax: [phone]  Consultation  Referring Provider:     Dr. Delman Chavez Primary Care Physician:  Patient, No Pcp Per Primary Gastroenterologist:  Gerald Manifold, MD        Reason for Consultation:    Abdominal pain, diverticulitis  Date of Admission:  07/01/2017 Date of Consultation:  07/01/2017         HPI:   Gerald Chavez is Chavez 37 y.o. male presented due to left lower quadrant, sharp, 8/10, nonradiating abdominal pain for 2 days.  No nausea vomiting, no blood in stool.  No altered bowel habits.   CT showed acute moderate sigmoid diverticulitis, small amount of gas along the margin of the inflamed colon "which could be Chavez contained small microperforation but could alternatively be contained in Chavez diverticulum".  No free air observed.  History reviewed. No pertinent past medical history.  Past Surgical History:  Procedure Laterality Date  . BACK SURGERY      Prior to Admission medications   Medication Sig Start Date End Date Taking? Authorizing Provider  cyclobenzaprine (FLEXERIL) 5 MG tablet Take 1 tablet (5 mg total) by mouth 3 (three) times daily as needed for muscle spasms. Patient not taking: Reported on 07/01/2017 09/19/14   Gerald Fraction A, NP  ibuprofen (ADVIL,MOTRIN) 800 MG tablet Take 1 tablet (800 mg total) by mouth 3 (three) times daily. Patient not taking: Reported on 07/01/2017 09/19/14   Gerald Cordial, NP    No family history on file.   Social History   Tobacco Use  . Smoking status: Current Some Day Smoker  . Smokeless tobacco: Never Used  Substance Use Topics  . Alcohol use: No  . Drug use: Not on file    Allergies as of 07/01/2017  . (No Known Allergies)    Review of Systems:    All systems reviewed and negative except where noted in HPI.   Physical Exam:  Vital signs in last 24  hours: Vitals:   07/01/17 0715 07/01/17 0718  BP: 129/81   Pulse: 80   Resp: 18   Temp: 98 F (36.7 C)   TempSrc: Oral   SpO2: 100%   Weight:  165 lb (74.8 kg)  Height:  5' 6" (1.676 m)     General:   Pleasant, cooperative in NAD Head:  Normocephalic and atraumatic. Eyes:   No icterus.   Conjunctiva pink. PERRLA. Ears:  Normal auditory acuity. Neck:  Supple; no masses or thyroidomegaly Lungs: Respirations even and unlabored. Lungs clear to auscultation bilaterally.   No wheezes, crackles, or rhonchi.  Heart:  Regular rate and rhythm;  Without murmur, clicks, rubs or gallops Abdomen:  Soft, nondistended, ttp llq. Normal bowel sounds. No appreciable masses or hepatomegaly.  No rebound or guarding.  Neurologic:  Alert and oriented x3;  grossly normal neurologically. Skin:  Intact without significant lesions or rashes. Cervical Nodes:  No significant cervical adenopathy. Psych:  Alert and cooperative. Normal affect.  LAB RESULTS: Recent Labs    07/01/17 0725  WBC 14.9*  HGB 15.7  HCT 46.3  PLT 197   BMET Recent Labs    07/01/17 0725  NA 137  K 3.2*  CL 103  CO2 27  GLUCOSE 97  BUN 15  CREATININE 0.98  CALCIUM 8.9   LFT Recent Labs    07/01/17 0725  PROT 7.8  ALBUMIN 4.3  AST 28  ALT 31  ALKPHOS 50  BILITOT 1.6*   PT/INR No results for input(s): LABPROT, INR in the last 72 hours.  STUDIES: Ct Abdomen Pelvis W Contrast  Result Date: 07/01/2017 CLINICAL DATA:  Chronic dysuria. Malodorous and dark colored urine. Pelvic and groin pain. EXAM: CT ABDOMEN AND PELVIS WITH CONTRAST TECHNIQUE: Multidetector CT imaging of the abdomen and pelvis was performed using the standard protocol following bolus administration of intravenous contrast. CONTRAST:  19m ISOVUE-300 IOPAMIDOL (ISOVUE-300) INJECTION 61% COMPARISON:  02/23/2016 FINDINGS: Lower chest: Unremarkable Hepatobiliary: Unremarkable Pancreas: Unremarkable Spleen: Unremarkable Adrenals/Urinary Tract: 0.8 by  0.4 cm hypodense lesion of the right mid kidney previously measured 0.6 by 0.3 cm by my measurements. This lesion is technically too small to characterize. The kidneys appear otherwise normal. The adrenal glands appear normal. Stomach/Bowel: There is acute diverticulitis involving the sigmoid colon with associated wall thickening, inflamed diverticula, and mesenteric edema. There is potentially Chavez small micro perforation in this vicinity for example on images 64-65 of series 2 where there is gas density not clearly surrounded by Chavez diverticular wall. Sometimes this distinction can be difficult on CT. No drainable abscess, this gas collection is only about 8 mm in diameter and appears locally contained. There is no gas freely distributed in the abdomen. Vascular/Lymphatic: Small retroperitoneal lymph nodes do not appear pathologically enlarged by size criteria. Reproductive: Unremarkable Other: No supplemental non-categorized findings. Musculoskeletal: Mild degenerative disc disease in the lower lumbar spine likely causing right foraminal impingement at L5-S1. IMPRESSION: 1. Acute moderate sigmoid diverticulitis. There is Chavez small amount of gas along the margin of the inflamed sigmoid colon which could be Chavez contained small micro perforation but could alternatively be contained in Chavez diverticulum. No free air is observed. No drainable abscess. I do not observe Chavez fistula to the bladder. 2. Right foraminal impingement at L5-S1 due to degenerative disc disease. 3. Small hypodense lesion in the right mid kidney is technically too small to characterize, although statistically likely to be benign. Electronically Signed   By: WVan ClinesM.D.   On: 07/01/2017 09:47      Impression / Plan:   [name] Chavez 37y.o. y/o male with presents with left lower quadrant abdominal pain, and found to have diverticulitis on CT scan with microperforation versus gas within diverticulum, with no free air  reported  Surgery consult for diverticulitis and possible microperforation Recommend antibiotics with GI coverage and observation in hospital for 1-2 days Patient will require Chavez colonoscopy as an outpatient after acute diverticulitis is resolved, and after repeat CT scan shows resolution of microperforation reported on today's CT scan. Continue daily abdominal exams, and if patient develops acute abdomen or acute changes in abdominal exam, contact surgeon immediately for evaluation of need for emergent surgery at that time Patient is expected to continue to improve with IV antibiotics, and fluids Advance diet once abdominal pain improves and patient able to tolerate oral diet  Bilirubin noted to be mildly elevated, with normal alk phos Would recommend repeating cmp tomorrow No evidence of biliary obstruction, or right upper quadrant pain CT shows hepatobiliary exam to be unremarkable as well  Thank you for involving me in the care of this patient.      LOS: 0 days   VVirgel Manifold MD  07/01/2017, 11:13 AM

## 2017-07-02 LAB — URINE CULTURE
Culture: NO GROWTH
Special Requests: NORMAL

## 2017-07-02 LAB — BASIC METABOLIC PANEL
Anion gap: 8 (ref 5–15)
BUN: 9 mg/dL (ref 6–20)
CO2: 25 mmol/L (ref 22–32)
CREATININE: 0.86 mg/dL (ref 0.61–1.24)
Calcium: 8.2 mg/dL — ABNORMAL LOW (ref 8.9–10.3)
Chloride: 103 mmol/L (ref 101–111)
GFR calc Af Amer: >60 mL/min (ref >60)
GLUCOSE: 108 mg/dL — AB (ref 65–99)
Potassium: 3.3 mmol/L — ABNORMAL LOW (ref 3.5–5.1)
Sodium: 136 mmol/L (ref 135–145)

## 2017-07-02 LAB — CBC
HEMATOCRIT: 39.2 % — AB (ref 40.0–52.0)
Hemoglobin: 13.6 g/dL (ref 13.0–18.0)
MCH: 30.6 pg (ref 26.0–34.0)
MCHC: 34.7 g/dL (ref 32.0–36.0)
MCV: 88.2 fL (ref 80.0–100.0)
PLATELETS: 168 10*3/uL (ref 150–440)
RBC: 4.44 MIL/uL (ref 4.40–5.90)
RDW: 12.9 % (ref 11.5–14.5)
WBC: 10.4 10*3/uL (ref 3.8–10.6)

## 2017-07-02 NOTE — Progress Notes (Signed)
CC: Acute diverticulitis Subjective: Patient states that he feels better today but is still having considerable pain and points to the suprapubic area.  He is having some pain when he urinates.  No nausea vomiting.  Objective: Vital signs in last 24 hours: Temp:  [97.8 F (36.6 C)-98.4 F (36.9 C)] 97.8 F (36.6 C) (02/24 0519) Pulse Rate:  [61-92] 61 (02/24 0519) Resp:  [14-18] 16 (02/24 0519) BP: (90-114)/(39-68) 92/57 (02/24 0519) SpO2:  [97 %-100 %] 100 % (02/24 0519) Last BM Date: 07/01/17  Intake/Output from previous day: 02/23 0701 - 02/24 0700 In: 2733 [P.O.:660; I.V.:1673; IV Piggyback:400] Out: -  Intake/Output this shift: No intake/output data recorded.  Physical exam:  Vital signs are stable he is afebrile he appears comfortable.  Abdomen is distended but soft there is minimal guarding no rebound or percussion tenderness point tenderness maximal at the suprapubic area and left lower quadrant.  Right lower quadrant tenderness is essentially resolved. Calves are nontender no icterus no jaundice  Lab Results: CBC  Recent Labs    07/01/17 1315 07/02/17 0634  WBC 15.9* 10.4  HGB 14.8 13.6  HCT 44.1 39.2*  PLT 196 168   BMET Recent Labs    07/01/17 0725 07/01/17 1315 07/02/17 0634  NA 137  --  136  K 3.2*  --  3.3*  CL 103  --  103  CO2 27  --  25  GLUCOSE 97  --  108*  BUN 15  --  9  CREATININE 0.98 0.79 0.86  CALCIUM 8.9  --  8.2*   PT/INR No results for input(s): LABPROT, INR in the last 72 hours. ABG No results for input(s): PHART, HCO3 in the last 72 hours.  Invalid input(s): PCO2, PO2  Studies/Results: Ct Abdomen Pelvis W Contrast  Result Date: 07/01/2017 CLINICAL DATA:  Chronic dysuria. Malodorous and dark colored urine. Pelvic and groin pain. EXAM: CT ABDOMEN AND PELVIS WITH CONTRAST TECHNIQUE: Multidetector CT imaging of the abdomen and pelvis was performed using the standard protocol following bolus administration of intravenous  contrast. CONTRAST:  ISOVUE-300 IOPAMIDOL (ISOVUE-300) INJECTION 61% COMPARISON:  02/23/2016 FINDINGS: Lower chest: Unremarkable Hepatobiliary: Unremarkable Pancreas: Unremarkable Spleen: Unremarkable Adrenals/Urinary Tract: 0.8 by 0.4 cm hypodense lesion of the right mid kidney previously measured 0.6 by 0.3 cm by my measurements. This lesion is technically too small to characterize. The kidneys appear otherwise normal. The adrenal glands appear normal. Stomach/Bowel: There is acute diverticulitis involving the sigmoid colon with associated wall thickening, inflamed diverticula, and mesenteric edema. There is potentially a small micro perforation in this vicinity for example on images 64-65 of series 2 where there is gas density not clearly surrounded by a diverticular wall. Sometimes this distinction can be difficult on CT. No drainable abscess, this gas collection is only about 8 mm in diameter and appears locally contained. There is no gas freely distributed in the abdomen. Vascular/Lymphatic: Small retroperitoneal lymph nodes do not appear pathologically enlarged by size criteria. Reproductive: Unremarkable Other: No supplemental non-categorized findings. Musculoskeletal: Mild degenerative disc disease in the lower lumbar spine likely causing right foraminal impingement at L5-S1. IMPRESSION: 1. Acute moderate sigmoid diverticulitis. There is a small amount of gas along the margin of the inflamed sigmoid colon which could be a contained small micro perforation but could alternatively be contained in a diverticulum. No free air is observed. No drainable abscess. I do not observe a fistula to the bladder. 2. Right foraminal impingement at L5-S1 due to degenerative disc disease. 3.  Small hypodense lesion in the right mid kidney is technically too small to characterize, although statistically likely to be benign. Electronically Signed   By: Gaylyn RongWalter  Liebkemann M.D.   On: 07/01/2017 09:47     Anti-infectives: Anti-infectives (From admission, onward)   Start     Dose/Rate Route Frequency Ordered Stop   07/01/17 1200  piperacillin-tazobactam (ZOSYN) IVPB 3.375 g     3.375 g 12.5 mL/hr over 240 Minutes Intravenous Every 8 hours 07/01/17 1139     07/01/17 1030  ciprofloxacin (CIPRO) IVPB 400 mg     400 mg 200 mL/hr over 60 Minutes Intravenous  Once 07/01/17 1015 07/01/17 1150   07/01/17 1030  metroNIDAZOLE (FLAGYL) IVPB 500 mg     500 mg 100 mL/hr over 60 Minutes Intravenous  Once 07/01/17 1015 07/01/17 1205   07/01/17 1015  ciprofloxacin (CIPRO) tablet 500 mg  Status:  Discontinued     500 mg Oral  Once 07/01/17 1000 07/01/17 1015   07/01/17 1015  metroNIDAZOLE (FLAGYL) tablet 500 mg  Status:  Discontinued     500 mg Oral  Once 07/01/17 1000 07/01/17 1015   07/01/17 0915  cefTRIAXone (ROCEPHIN) injection 250 mg  Status:  Discontinued     250 mg Intramuscular  Once 07/01/17 0907 07/01/17 1047      Assessment/Plan:  White blood cell count has normalized.  Patient is doing very well on IV antibiotics.  He is only been on IVs for 24 hours and would benefit from another day before switching to orals and discharging.  Lattie Hawichard E Victoria Euceda, MD, FACS  07/02/2017

## 2017-07-03 LAB — HIV ANTIBODY (ROUTINE TESTING W REFLEX): HIV Screen 4th Generation wRfx: NONREACTIVE

## 2017-07-03 MED ORDER — KETOROLAC TROMETHAMINE 30 MG/ML IJ SOLN
30.0000 mg | Freq: Four times a day (QID) | INTRAMUSCULAR | Status: DC
  Administered 2017-07-03 – 2017-07-04 (×4): 30 mg via INTRAVENOUS
  Filled 2017-07-03 (×4): qty 1

## 2017-07-03 MED ORDER — AMOXICILLIN-POT CLAVULANATE 875-125 MG PO TABS
1.0000 | ORAL_TABLET | Freq: Two times a day (BID) | ORAL | Status: DC
  Administered 2017-07-03 – 2017-07-04 (×2): 1 via ORAL
  Filled 2017-07-03 (×2): qty 1

## 2017-07-03 NOTE — Progress Notes (Signed)
07/03/2017  Subjective: No acute events overnight.  Patient reports improved pain.  Tolerating clears.  Having flatus but no bowel movement.  Vital signs: Temp:  [98.1 F (36.7 C)-98.5 F (36.9 C)] 98.5 F (36.9 C) (02/25 1249) Pulse Rate:  [57-72] 72 (02/25 1249) Resp:  [17-18] 17 (02/25 1249) BP: (112-119)/(71-80) 119/80 (02/25 1249) SpO2:  [100 %] 100 % (02/25 1249)   Intake/Output: 02/24 0701 - 02/25 0700 In: 4819.5 [P.O.:1720; I.V.:2999.5; IV Piggyback:100] Out: 1075 [Urine:1075] Last BM Date: 07/01/17  Physical Exam: Constitutional: No acute distress Abdomen:  Soft, nondistended, with mild tenderness to palpation over low abdomen.  This improved compared to yesterday per patient.  Labs:  Recent Labs    07/01/17 1315 07/02/17 0634  WBC 15.9* 10.4  HGB 14.8 13.6  HCT 44.1 39.2*  PLT 196 168   Recent Labs    07/01/17 0725 07/01/17 1315 07/02/17 0634  NA 137  --  136  K 3.2*  --  3.3*  CL 103  --  103  CO2 27  --  25  GLUCOSE 97  --  108*  BUN 15  --  9  CREATININE 0.98 0.79 0.86  CALCIUM 8.9  --  8.2*   No results for input(s): LABPROT, INR in the last 72 hours.  Imaging: No results found.  Assessment/Plan: 37 yo male with acute diverticulitis  --will advance to full liquids today.  --d/c IV fluids --continue IV antibiotics and likely transition to po tomorrow --OOB, ambulate   Gerald IllJose Luis Porsche Noguchi, MD [facility]

## 2017-07-04 LAB — CBC WITH DIFFERENTIAL/PLATELET
Basophils Absolute: 0.1 10*3/uL (ref 0–0.1)
Basophils Relative: 1 %
EOS PCT: 5 %
Eosinophils Absolute: 0.3 10*3/uL (ref 0–0.7)
HEMATOCRIT: 43.7 % (ref 40.0–52.0)
Hemoglobin: 14.9 g/dL (ref 13.0–18.0)
LYMPHS ABS: 2.4 10*3/uL (ref 1.0–3.6)
LYMPHS PCT: 38 %
MCH: 30.6 pg (ref 26.0–34.0)
MCHC: 34.2 g/dL (ref 32.0–36.0)
MCV: 89.6 fL (ref 80.0–100.0)
MONO ABS: 0.6 10*3/uL (ref 0.2–1.0)
MONOS PCT: 10 %
NEUTROS ABS: 2.9 10*3/uL (ref 1.4–6.5)
Neutrophils Relative %: 46 %
PLATELETS: 242 10*3/uL (ref 150–440)
RBC: 4.88 MIL/uL (ref 4.40–5.90)
RDW: 12.8 % (ref 11.5–14.5)
WBC: 6.3 10*3/uL (ref 3.8–10.6)

## 2017-07-04 MED ORDER — HYDROCODONE-ACETAMINOPHEN 5-325 MG PO TABS: 1.0000 | ORAL_TABLET | ORAL | 0 refills | Status: DC | PRN

## 2017-07-04 MED ORDER — AMOXICILLIN-POT CLAVULANATE 875-125 MG PO TABS: 1.0000 | ORAL_TABLET | Freq: Two times a day (BID) | ORAL | 0 refills | Status: AC

## 2017-07-04 MED ORDER — IBUPROFEN 600 MG PO TABS: 600.0000 mg | ORAL_TABLET | Freq: Three times a day (TID) | ORAL | 0 refills | Status: DC | PRN

## 2017-07-04 NOTE — Care Management (Signed)
Patient discharged today.  Provided with coupon from goodrx.com  Out of pocket cost $26.24.  Patient denies issues obtaining medications.  Patient provided with "The Network:  Your Guide to [employer] [facility] [place]"  Booklet

## 2017-07-04 NOTE — Progress Notes (Signed)
Patient discharge teaching given, including activity, diet, follow-up appoints, and medications. Patient verbalized understanding of all discharge instructions. IV access was d/c'd. Vitals are stable. Skin is intact except as charted in most recent assessments. Pt refused to be escorted out, to be driven home by family.  Gerald Chavez  

## 2017-07-04 NOTE — Discharge Summary (Signed)
Patient ID: Gerald LimerickJose Castro Chavez MRN: [id number] DOB/AGE: 38/01/1981 37 y.o.  Admit date: 07/01/2017 Discharge date: 07/04/2017   Discharge Diagnoses:  Active Problems:   Acute diverticulitis of intestine   Procedures:  None  Hospital Course: Patient was admitted on 2/23 with acute diverticulitis.  He was initially NPO with IV fluid hydration and IV antibiotics.  As his pain improved and his WBC normalized, his diet was slowly advanced and his antibiotic was transitioned to po.  He did well in hospital and will be discharged in good condition.  On exam, he was in no acute distress with stable vital signs.  Abdomen was soft, nondistended, only with mild soreness to palpation.  Consults: None  Disposition: 01-Home or Self Care  Discharge Instructions    Call MD for:  difficulty breathing, headache or visual disturbances   Complete by:  As directed    Call MD for:  persistant nausea and vomiting   Complete by:  As directed    Call MD for:  severe uncontrolled pain   Complete by:  As directed    Call MD for:  temperature >100.4   Complete by:  As directed    Diet - low sodium heart healthy   Complete by:  As directed    Discharge instructions   Complete by:  As directed    1.  Continue soft, bland diet for the next two weeks, then may start taking a high fiber diet 2.  May take MiraLax once daily as needed to keep bowels regular and soft 3.  Continue antibiotic course until completed   Driving Restrictions   Complete by:  As directed    Do not drive while taking narcotics for pain control.   Increase activity slowly   Complete by:  As directed      Allergies as of 07/04/2017   No Known Allergies     Medication List    TAKE these medications   amoxicillin-clavulanate 875-125 MG tablet Commonly known as:  AUGMENTIN Take 1 tablet by mouth every 12 (twelve) hours for 12 days.   cyclobenzaprine 5 MG tablet Commonly known as:  FLEXERIL Take 1 tablet (5 mg total) by mouth 3  (three) times daily as needed for muscle spasms.   HYDROcodone-acetaminophen 5-325 MG tablet Commonly known as:  NORCO/VICODIN Take 1 tablet by mouth every 4 (four) hours as needed for severe pain.   ibuprofen 600 MG tablet Commonly known as:  ADVIL,MOTRIN Take 1 tablet (600 mg total) by mouth every 8 (eight) hours as needed for fever or moderate pain. What changed:    medication strength  how much to take  when to take this  reasons to take this      Follow-up Information    Gerald Chavez, Gerald QuickJose, MD Follow up in 3 week(s).   Specialty:  Surgery Contact information: [address] [place] [address] [phone]

## 2017-07-24 ENCOUNTER — Encounter: Payer: Self-pay | Admitting: Surgery

## 2017-07-24 ENCOUNTER — Ambulatory Visit: Payer: Self-pay | Admitting: Surgery

## 2017-07-24 ENCOUNTER — Other Ambulatory Visit
Admission: RE | Admit: 2017-07-24 | Discharge: 2017-07-24 | Disposition: A | Discharge status: Home / Self Care | Payer: Self-pay | Source: Ambulatory Visit | Attending: Surgery | Admitting: Surgery

## 2017-07-24 VITALS — BP 118/76 | HR 62 | Temp 98.3°F | Ht 68.0 in | Wt 176.0 lb

## 2017-07-24 DIAGNOSIS — R1032 Left lower quadrant pain: Secondary | ICD-10-CM

## 2017-07-24 DIAGNOSIS — K5792 Diverticulitis of intestine, part unspecified, without perforation or abscess without bleeding: Secondary | ICD-10-CM

## 2017-07-24 LAB — CBC WITH DIFFERENTIAL/PLATELET
BAND NEUTROPHILS: 0 %
BASOS ABS: 0 10*3/uL (ref 0–0.1)
BASOS PCT: 0 %
Blasts: 0 %
EOS ABS: 0.7 10*3/uL (ref 0–0.7)
EOS PCT: 10 %
HCT: 47.1 % (ref 40.0–52.0)
Hemoglobin: 15.9 g/dL (ref 13.0–18.0)
LYMPHS ABS: 3.5 10*3/uL (ref 1.0–3.6)
Lymphocytes Relative: 50 %
MCH: 30.1 pg (ref 26.0–34.0)
MCHC: 33.7 g/dL (ref 32.0–36.0)
MCV: 89.3 fL (ref 80.0–100.0)
METAMYELOCYTES PCT: 0 %
MONO ABS: 0.3 10*3/uL (ref 0.2–1.0)
Monocytes Relative: 4 %
Myelocytes: 0 %
Neutro Abs: 2.6 10*3/uL (ref 1.4–6.5)
Neutrophils Relative %: 36 %
Other: 0 %
PLATELETS: 250 10*3/uL (ref 150–440)
PROMYELOCYTES ABS: 0 %
RBC: 5.28 MIL/uL (ref 4.40–5.90)
RDW: 13.3 % (ref 11.5–14.5)
WBC: 7.1 10*3/uL (ref 3.8–10.6)
nRBC: 0 /100 WBC

## 2017-07-24 LAB — COMPREHENSIVE METABOLIC PANEL
ALK PHOS: 56 U/L (ref 38–126)
ALT: 34 U/L (ref 17–63)
AST: 28 U/L (ref 15–41)
Albumin: 4.3 g/dL (ref 3.5–5.0)
Anion gap: 9 (ref 5–15)
BUN: 12 mg/dL (ref 6–20)
CALCIUM: 8.7 mg/dL — AB (ref 8.9–10.3)
CHLORIDE: 101 mmol/L (ref 101–111)
CO2: 24 mmol/L (ref 22–32)
CREATININE: 0.89 mg/dL (ref 0.61–1.24)
GFR calc Af Amer: >60 mL/min (ref >60)
GFR calc non Af Amer: >60 mL/min (ref >60)
Glucose, Bld: 100 mg/dL — ABNORMAL HIGH (ref 65–99)
Potassium: 3.7 mmol/L (ref 3.5–5.1)
SODIUM: 134 mmol/L — AB (ref 135–145)
Total Bilirubin: 0.9 mg/dL (ref 0.3–1.2)
Total Protein: 7.3 g/dL (ref 6.5–8.1)

## 2017-07-24 LAB — URINALYSIS, ROUTINE W REFLEX MICROSCOPIC
Bilirubin Urine: NEGATIVE
GLUCOSE, UA: NEGATIVE mg/dL
Hgb urine dipstick: NEGATIVE
KETONES UR: NEGATIVE mg/dL
LEUKOCYTES UA: NEGATIVE
NITRITE: NEGATIVE
PROTEIN: NEGATIVE mg/dL
Specific Gravity, Urine: 1.023 (ref 1.005–1.030)
pH: 6 (ref 5.0–8.0)

## 2017-07-24 MED ORDER — AMOXICILLIN-POT CLAVULANATE 875-125 MG PO TABS: 1.0000 | ORAL_TABLET | Freq: Two times a day (BID) | ORAL | 0 refills | Status: DC

## 2017-07-24 NOTE — Progress Notes (Signed)
07/24/2017  History of Present Illness: Gerald Chavez is a 37 y.o. male who presents for follow-up of acute diverticulitis.  He was in the hospital between 2/23 and 2/26 and was discharged home with a course of antibiotics.  He has completed antibiotic course and reports that he has been having some left lower quadrant pain while after eating although was not as severe as it had been while he was in the hospital.  He also reports having some dysuria recently.  He does have a history of chronic pelvic pain and scrotal pain he had been seen by Dr. Lonna CobbStoioff in the past.  Past Medical History: Past Medical History:  Diagnosis Date  . Acute diverticulitis of intestine 07/01/2017  . Scrotal pain 01/27/2016     Past Surgical History: Past Surgical History:  Procedure Laterality Date  . BACK SURGERY      Home Medications: Prior to Admission medications   Medication Sig Start Date End Date Taking? Authorizing Provider  cyclobenzaprine (FLEXERIL) 5 MG tablet Take 1 tablet (5 mg total) by mouth 3 (three) times daily as needed for muscle spasms. 09/19/14  Yes Betancourt, Jarold Songina A, NP  HYDROcodone-acetaminophen (NORCO/VICODIN) 5-325 MG tablet Take 1 tablet by mouth every 4 (four) hours as needed for severe pain. 07/04/17  Yes Reagann Dolce, Elita QuickJose, MD  ibuprofen (ADVIL,MOTRIN) 600 MG tablet Take 1 tablet (600 mg total) by mouth every 8 (eight) hours as needed for fever or moderate pain. 07/04/17  Yes Climmie Cronce, Elita QuickJose, MD  amoxicillin-clavulanate (AUGMENTIN) 875-125 MG tablet Take 1 tablet by mouth 2 (two) times daily. 07/24/17   Henrene DodgePiscoya, [place], MD    Allergies: No Known Allergies  Review of Systems: Review of Systems  Constitutional: Negative for chills and fever.  Respiratory: Negative for shortness of breath.   Cardiovascular: Negative for chest pain.  Gastrointestinal: Positive for abdominal pain. Negative for constipation, diarrhea, nausea and vomiting.  Genitourinary: Positive for dysuria. Negative for  hematuria.    Physical Exam BP 118/76   Pulse 62   Temp 98.3 F (36.8 C) (Oral)   Ht 5\' 8"  (1.727 m)   Wt 79.8 kg (176 lb)   BMI 26.76 kg/m  CONSTITUTIONAL: No acute distress RESPIRATORY:  Lungs are clear, and breath sounds are equal bilaterally. Normal respiratory effort without pathologic use of accessory muscles. CARDIOVASCULAR: Heart is regular without murmurs, gallops, or rubs. GI: The abdomen is soft, nondistended, with some mild tenderness to palpation over the left lower quadrant and pelvis.  MUSCULOSKELETAL:  Normal muscle strength and tone in all four extremities.  No peripheral edema or cyanosis. SKIN: Skin turgor is normal. There are no pathologic skin lesions.  NEUROLOGIC:  Motor and sensation is grossly normal.  Cranial nerves are grossly intact. PSYCH:  Alert and oriented to person, place and time. Affect is normal.  Labs/Imaging: None recently  Assessment and Plan: This is a 37 y.o. male who presents for follow up from acute diverticulitis episode  Discussed with the patient that it is unclear if his symptoms could be from a urinary tract infection or from some mild recurrent diverticulitis.  He reports that while he was on antibiotics, his symptoms had gone away.   Will order a urinalysis with urine culture, CBC, and CMP.  He has to go to [place] for work today and would not be able to come back to office soon, so we'll also give him a prescription for Augmentin 2 week course as a precaution.  We will call him with the results  of the labs and whether he needs to take the Augmentin or not.  Then based on this we'll set up a follow up appointment.  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  Patient understands this plan and all of his questions have been answered.   Howie Ill, MD [facility]

## 2017-07-24 NOTE — Patient Instructions (Addendum)
Por favor vaya al [facility] para que le puedan hacer analisis de urina y de [place].  Lo vamos a referir al [facility].    Diverticulitis (Diverticulitis) La diverticulitis ocurre cuando pequeos bolsillos que se han formado en el colon (intestino grueso) se infectan o se inflaman. CUIDADOS EN EL HOGAR  Siga las indicaciones del mdico.  Siga una dieta especial si el mdico se lo indic.  Cuando se sienta mejor, el mdico puede indicarle que cambie la dieta. Tal vez le indiquen que coma gran cantidad de [place]. Las frutas y los vegetales son buenas fuentes de [place]. La fibra facilita la evacuacin intestinal (defecacin).  Tome los suplementos o los probiticos como le indic el mdico.  Tome los medicamentos solamente como se lo haya indicado el mdico.  Cumpla con todas las visitas de control con su mdico.  SOLICITE AYUDA SI:  El dolor no mejora.  Le resulta difcil alimentarse.  No defeca como lo hace normalmente.  SOLICITE AYUDA DE INMEDIATO SI:  El dolor empeora.  Los problemas no mejoran.  Los problemas empeoran repentinamente.  Tiene fiebre.  No deja de vomitar.  La materia fecal (heces) es sanguinolenta o negra, de aspecto alquitranado.  ASEGRESE DE QUE:  Comprende estas instrucciones.  Controlar su afeccin.  Recibir ayuda de inmediato si no mejora o si empeora.  Esta informacin no tiene Theme park managercomo fin reemplazar el consejo del mdico. Asegrese de hacerle al mdico cualquier pregunta que tenga. Document Released: 04/14/2011 Document Revised: 04/30/2013 Document Reviewed: 03/20/2013 Elsevier Interactive Patient Education  2017 [employer]

## 2017-07-25 LAB — URINE CULTURE: Culture: NO GROWTH

## 2017-07-26 ENCOUNTER — Telehealth: Payer: Self-pay

## 2017-07-26 NOTE — Telephone Encounter (Signed)
Called patient to let him know about his lab results. Patient had no further questions.  Informed patient that we are still waiting for the urologist referral.

## 2017-07-26 NOTE — Telephone Encounter (Signed)
-----   Message from Henrene DodgeJose Piscoya, MD sent at 07/25/2017  2:07 PM EDT ----- Regarding: follow up labs Hi,  Looking at all the labs and urine test and culture.  There's really nothing of concern.  WBC is very normal, and his urinalysis was negative and culture was negative.  Could you please inform him of his results and that he does not need to take the antibiotics?  Thanks!  Visteon CorporationJose

## 2017-08-28 ENCOUNTER — Telehealth: Payer: Self-pay | Admitting: Gastroenterology

## 2017-08-28 ENCOUNTER — Encounter: Payer: Self-pay | Admitting: Urology

## 2017-08-28 ENCOUNTER — Ambulatory Visit (INDEPENDENT_AMBULATORY_CARE_PROVIDER_SITE_OTHER): Payer: Self-pay | Admitting: Urology

## 2017-08-28 VITALS — BP 146/87 | HR 76 | Ht 66.0 in | Wt 175.0 lb

## 2017-08-28 DIAGNOSIS — N5082 Scrotal pain: Secondary | ICD-10-CM

## 2017-08-28 LAB — URINALYSIS, COMPLETE
BILIRUBIN UA: NEGATIVE
GLUCOSE, UA: NEGATIVE
Ketones, UA: NEGATIVE
Leukocytes, UA: NEGATIVE
NITRITE UA: NEGATIVE
Protein, UA: NEGATIVE
Specific Gravity, UA: 1.025 (ref 1.005–1.030)
Urobilinogen, Ur: 0.2 mg/dL (ref 0.2–1.0)
pH, UA: 5.5 (ref 5.0–7.5)

## 2017-08-28 MED ORDER — AMITRIPTYLINE HCL 25 MG PO TABS: 25.0000 mg | ORAL_TABLET | Freq: Every day | ORAL | 1 refills | Status: DC

## 2017-08-28 NOTE — Telephone Encounter (Signed)
Left vm for pt to call office and schedule 1-2 month fu with Dr.Tahiliani

## 2017-08-28 NOTE — Progress Notes (Signed)
08/28/2017 1:47 PM   Antonieta LovelessJose Castro [facility] 09/01/1980 [id number]  Referring provider: Henrene DodgePiscoya, Drayk, MD [address] [place], [place] [address]  Chief Complaint  Patient presents with  . Other    HPI: I previously [facility] the [facility] in August 2017 for left scrotal content pain. He had an unremarkable scrotal ultrasound.  He was initially treated with tamsulosin and ibuprofen and had minimal improvement in his symptoms.  A CT and cystoscopy were subsequently performed which showed no significant abnormalities.  He was given a trial of amitriptyline which improved his symptoms however he had side effects of reflux.  Pelvic floor physical therapy was recommended however he apparently was never contacted.  He made an appointment here for a second opinion  however this was apparently not conveyed and he was scheduled with me today and desired not to reschedule.  He complains of a burning sensation in his left hemiscrotal region which is worse after ejaculation.  He also complains of a burning pelvic pain. Marland Kitchen.  PMH: Past Medical History:  Diagnosis Date  . Acute diverticulitis of intestine 07/01/2017  . Scrotal pain 01/27/2016    Surgical History: Past Surgical History:  Procedure Laterality Date  . BACK SURGERY      Home Medications:  Allergies as of 08/28/2017   No Known Allergies     Medication List    as of 08/28/2017  1:47 PM   You have not been prescribed any medications.     Allergies: No Known Allergies  Family History: No family history on file.  Social History:  reports that he has been smoking.  He has never used smokeless tobacco. He reports that he does not drink alcohol or use drugs.  ROS: UROLOGY Frequent Urination?: Yes Hard to postpone urination?: Yes Burning/pain with urination?: Yes Get up at night to urinate?: Yes Leakage of urine?: Yes Urine stream starts and stops?: Yes Trouble starting stream?: Yes Do you have to strain to urinate?:  Yes Blood in urine?: No Urinary tract infection?: No Sexually transmitted disease?: No Injury to kidneys or bladder?: No Painful intercourse?: Yes Weak stream?: Yes Erection problems?: Yes Penile pain?: No  Gastrointestinal Nausea?: No Vomiting?: No Indigestion/heartburn?: Yes Diarrhea?: No Constipation?: No  Constitutional Fever: No Night sweats?: No Weight loss?: No Fatigue?: No  Skin Skin rash/lesions?: No Itching?: No  Eyes Blurred vision?: No Double vision?: No  Ears/Nose/Throat Sore throat?: No Sinus problems?: No  Hematologic/Lymphatic Swollen glands?: No Easy bruising?: No  Cardiovascular Leg swelling?: No Chest pain?: No  Respiratory Cough?: No Shortness of breath?: No  Endocrine Excessive thirst?: No  Musculoskeletal Back pain?: No Joint pain?: No  Neurological Headaches?: No Dizziness?: No  Psychologic Depression?: No Anxiety?: No  Physical Exam: BP (!) 146/87   Pulse 76   Ht 5\' 6"  (1.676 m)   Wt 175 lb (79.4 kg)   BMI 28.25 kg/m   Constitutional:  Alert and oriented, No acute distress. HEENT: [place] AT, moist mucus membranes.  Trachea midline, no masses. Cardiovascular: No clubbing, cyanosis, or edema. Respiratory: Normal respiratory effort, no increased work of breathing. GI: Abdomen is soft, nontender, nondistended, no abdominal masses GU: No CVA tenderness Lymph: No cervical or inguinal lymphadenopathy. Skin: No rashes, bruises or suspicious lesions. Neurologic: Grossly intact, no focal deficits, moving all 4 extremities. Psychiatric: Normal mood and affect.  Laboratory Data: Lab Results  Component Value Date   WBC 7.1 07/24/2017   HGB 15.9 07/24/2017   HCT 47.1 07/24/2017  MCV 89.3 07/24/2017   PLT 250 07/24/2017    Lab Results  Component Value Date   CREATININE 0.89 07/24/2017     Assessment & Plan:   37 year old male with a chronic scrotal contents/pelvic pain.  Will retry amitriptyline and he will take  OTC reflux medication.  He is currently working in [place], [place] and will look for PT availability where he works.    Return if symptoms worsen or fail to improve.   Riki Altes, MD  [facility] [address], [address] [place], [place] [address] [phone]

## 2017-09-14 ENCOUNTER — Encounter: Payer: Self-pay | Admitting: Urology

## 2017-09-20 IMAGING — CT CT ABD-PELV W/ CM
2 of 4 series · 16 of 46 positions shown, 18 images · IV contrast (APPLIED)
Comparison: None.

CLINICAL DATA: Complaining of left flank pain radiating to the
right hand MMM 3 days. Test area abdominal. Overall O

EXAM:
CT ABDOMEN AND PELVIS WITH CONTRAST
TECHNIQUE: Multidetector CT imaging of the abdomen and pelvis was performed
using the standard protocol following bolus administration of
intravenous contrast.
CONTRAST:  100mL 3DYS49-HQQ IOPAMIDOL (3DYS49-HQQ) INJECTION 61%

[Series 2: axial st · axial · 0.73mm/px · z∈[-526,-70]mm · 13 of 99 slices shown, 15 images]
[im 4/99  soft-tissue]
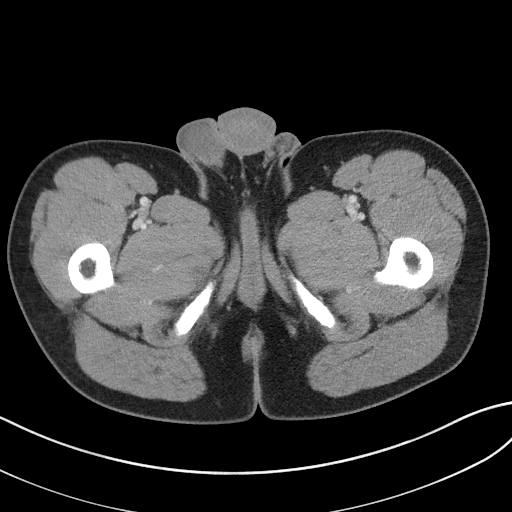
[im 4/99  bone]
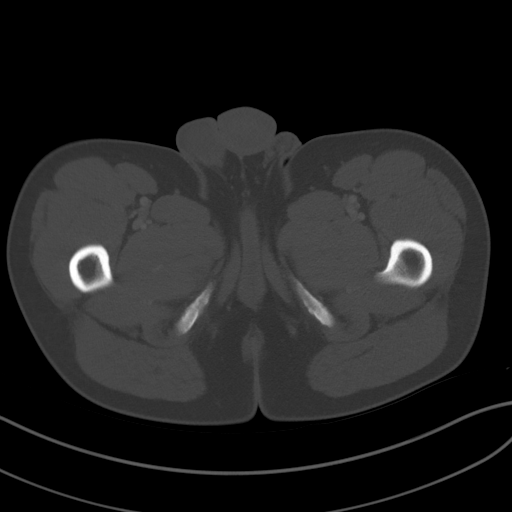
[im 12/99  soft-tissue]
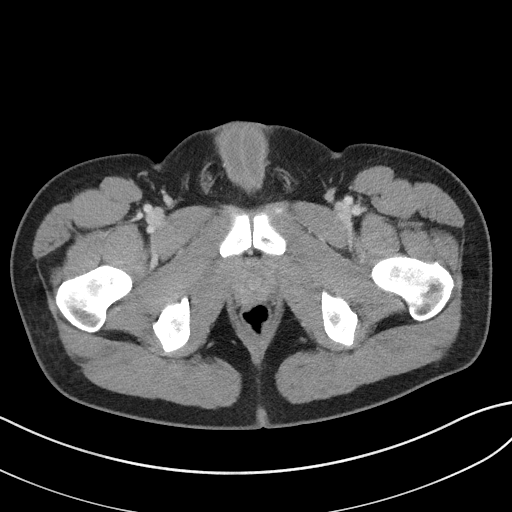
[im 19/99  soft-tissue]
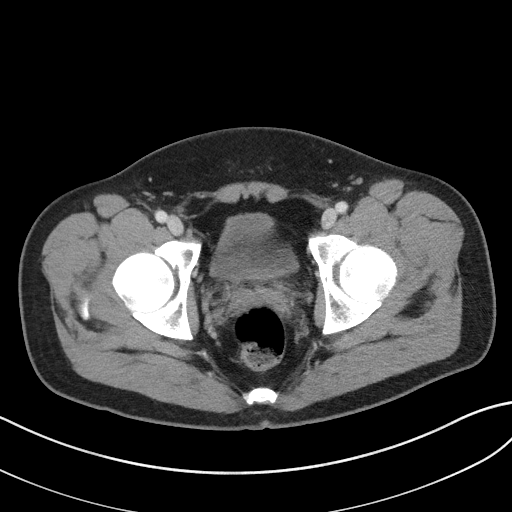
[im 27/99  soft-tissue]
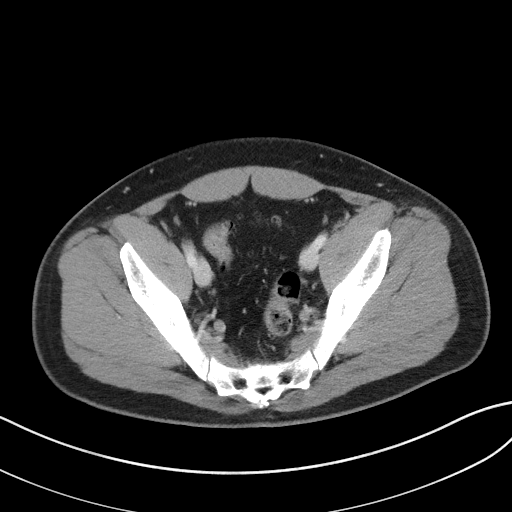
[im 34/99  soft-tissue]
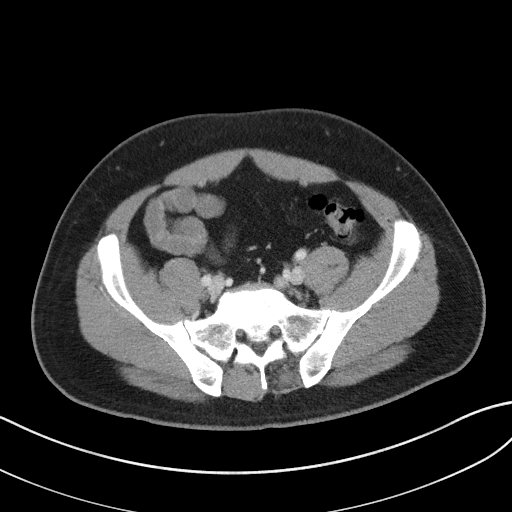
[im 42/99  soft-tissue]
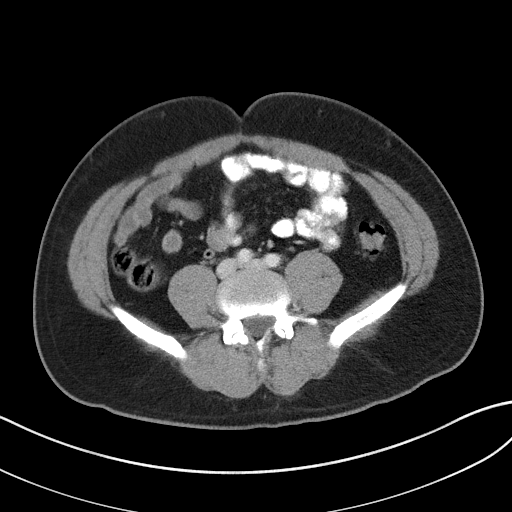
[im 50/99  soft-tissue]
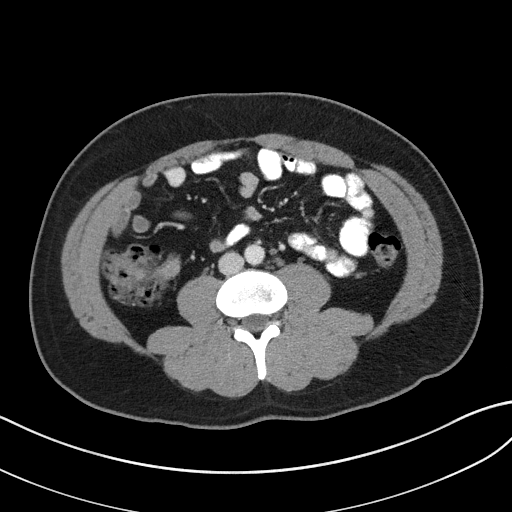
[im 57/99  soft-tissue]
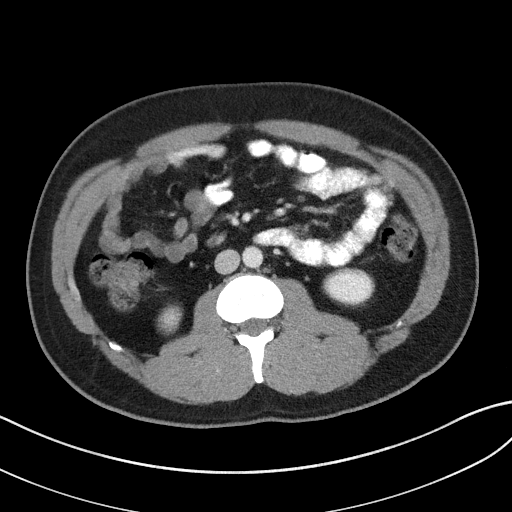
[im 65/99  soft-tissue]
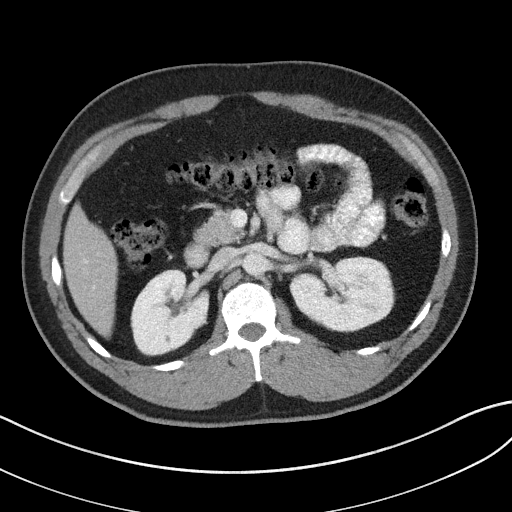
[im 65/99  bone]
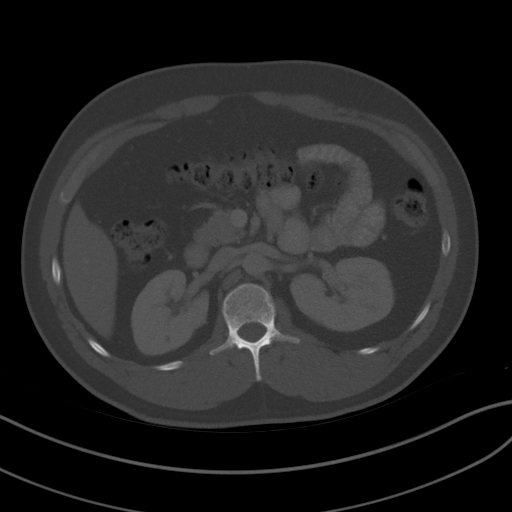
[im 72/99  soft-tissue]
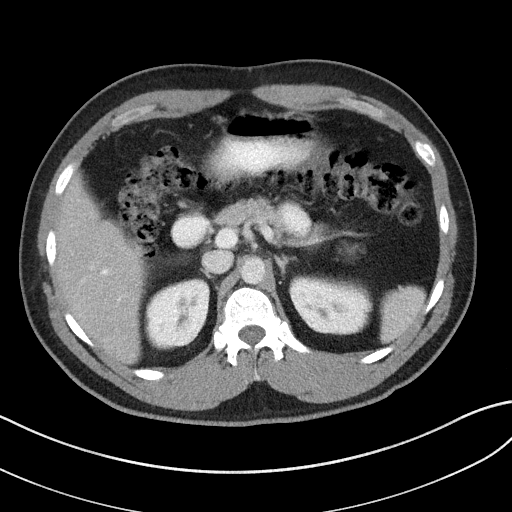
[im 80/99  soft-tissue]
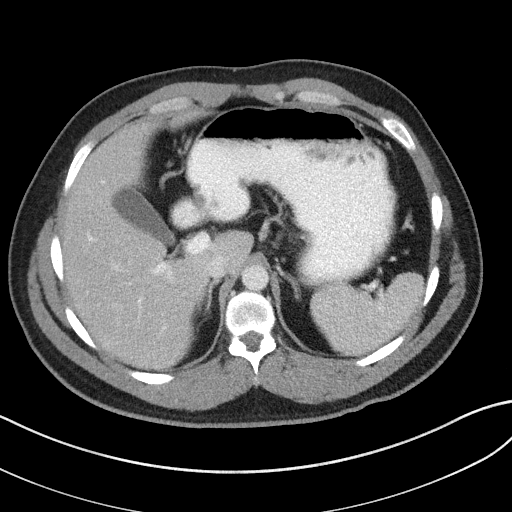
[im 87/99  soft-tissue]
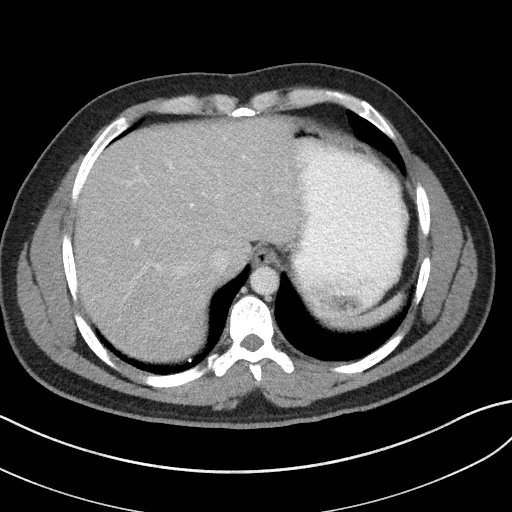
[im 95/99  soft-tissue]
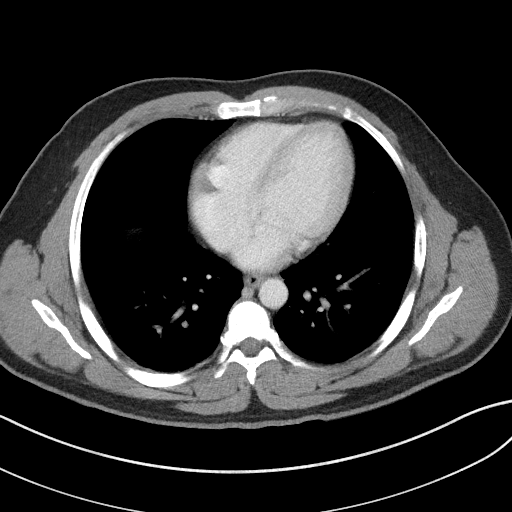

[Series 5: coronal st · coronal · 0.78mm/px · 3 of 82 slices shown]
[im 28/82  soft-tissue]
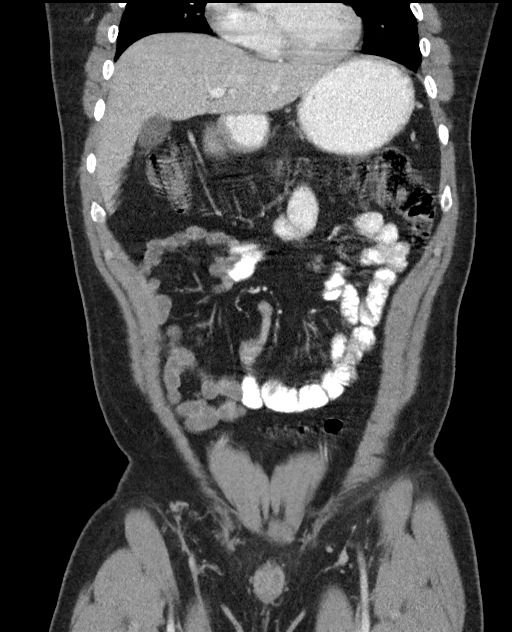
[im 37/82  soft-tissue]
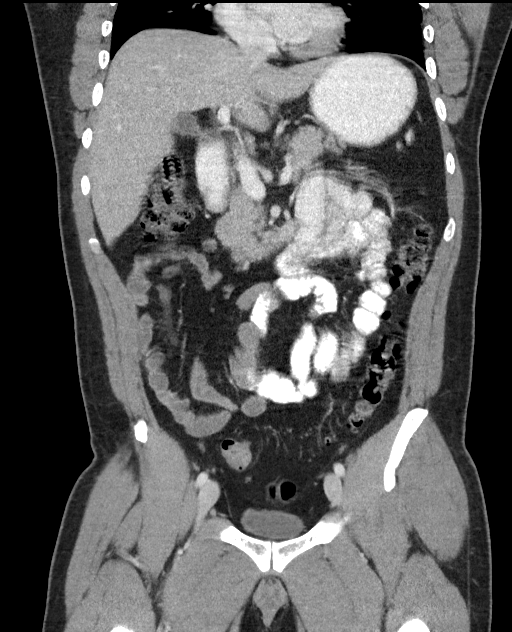
[im 46/82  soft-tissue]
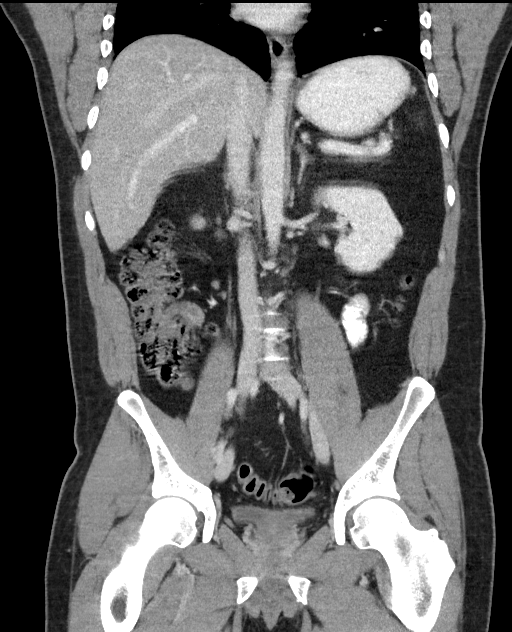

[16 of 46 positions shown; findings below may reference images not displayed]

FINDINGS: Lower chest: No acute abnormality.

Hepatobiliary: No focal liver abnormality is seen. No gallstones,
gallbladder wall thickening, or biliary dilatation.

Pancreas: Unremarkable. No pancreatic ductal dilatation or
surrounding inflammatory changes.

Spleen: Normal in size without focal abnormality.

Adrenals/Urinary Tract: 3 mm interpolar renal cyst on the right too
small to further characterize. No obstructive uropathy. Bladder is
physiologic in appearance without significant distention or
calculus.

Stomach/Bowel: Stomach is within normal limits. Appendix appears
normal. No evidence of bowel wall thickening, distention, or
inflammatory changes. Moderate fecal residue throughout large bowel.

Vascular/Lymphatic: No significant vascular findings are present. No
enlarged abdominal or pelvic lymph nodes.

Reproductive: Prostate is unremarkable.

Other: No abdominal wall hernia or abnormality. No abdominopelvic
ascites.

Musculoskeletal: Mild disc bulge L4-5. Slight disc space narrowing
at L5-S1. No acute osseous abnormality.
IMPRESSION: No acute intra-abdominal nor pelvic finding.

Mild disc bulge L4-5.

## 2017-10-30 ENCOUNTER — Ambulatory Visit: Payer: Self-pay | Admitting: Gastroenterology

## 2017-10-30 ENCOUNTER — Encounter: Payer: Self-pay | Admitting: Gastroenterology

## 2017-12-15 ENCOUNTER — Encounter: Payer: Self-pay | Admitting: Gastroenterology

## 2017-12-15 ENCOUNTER — Ambulatory Visit: Payer: Self-pay | Admitting: Gastroenterology

## 2017-12-15 ENCOUNTER — Other Ambulatory Visit: Payer: Self-pay

## 2017-12-15 ENCOUNTER — Encounter (INDEPENDENT_AMBULATORY_CARE_PROVIDER_SITE_OTHER): Payer: Self-pay

## 2017-12-15 VITALS — BP 113/73 | HR 60 | Resp 16 | Ht 66.0 in | Wt 173.2 lb

## 2017-12-15 DIAGNOSIS — K5732 Diverticulitis of large intestine without perforation or abscess without bleeding: Secondary | ICD-10-CM

## 2017-12-15 DIAGNOSIS — G8929 Other chronic pain: Secondary | ICD-10-CM

## 2017-12-15 DIAGNOSIS — R1032 Left lower quadrant pain: Secondary | ICD-10-CM

## 2017-12-15 MED ORDER — CIPROFLOXACIN HCL 500 MG PO TABS: 500.0000 mg | ORAL_TABLET | Freq: Two times a day (BID) | ORAL | 0 refills | Status: AC

## 2017-12-15 MED ORDER — METRONIDAZOLE 500 MG PO TABS: 500.0000 mg | ORAL_TABLET | Freq: Two times a day (BID) | ORAL | 0 refills | Status: AC

## 2017-12-15 NOTE — Progress Notes (Signed)
Arlyss Repressohini R Staton Markey, MD [address]  [address]  [place], [place] [address]  Main: [phone]  Fax: [phone]    Gastroenterology Consultation  Referring Provider:     No ref. provider found Primary Care Physician:  Patient, No Pcp Per Primary Gastroenterologist:  Dr. Arlyss Repressohini R Brysten Reister Reason for Consultation:     Follow-up of acute sigmoid diverticulitis, chronic left lower quadrant pain        HPI:   Duanne LimerickJose Castro Calixto is a 37 y.o. male here as a hospital follow-up for consultation & management of acute sigmoid diverticulitis. He was [facility] to [facility]/2017 secondary to severe left lower quadrant pain, underwent CT which revealed acute moderate sigmoid diverticulitis with possible microperforation. He received IV antibiotics and was discharged home in 3 days. He was seen by general surgery who did not recommend sigmoid colectomy. Patient was followed up by the Gen. Surgery as outpatient and was given another 2 weeks course of Augmentin. Patient reports chronic intermittent left lower quadrant pain, mild to moderate in intensity and triggered particularly after eating. He reports having normal bowel movements, denies diarrhea or rectal bleeding. [place]. He denies fever, chills, nausea or vomiting  NSAIDs: none  Antiplts/Anticoagulants/Anti thrombotics: none  GI Procedures: none He denies family history of GI malignancy  Past Medical History:  Diagnosis Date  . Acute diverticulitis of intestine 07/01/2017  . Scrotal pain 01/27/2016    Past Surgical History:  Procedure Laterality Date  . BACK SURGERY      Current Outpatient Medications:  .  amitriptyline (ELAVIL) 25 MG tablet, Take 1 tablet (25 mg total) by mouth at bedtime. (Patient not taking: Reported on 12/15/2017), Disp: 30 tablet, Rfl: 1 .  ciprofloxacin (CIPRO) 500 MG tablet, Take 1 tablet (500 mg total) by mouth 2 (two) times daily for 14 days., Disp: 28  tablet, Rfl: 0 .  metroNIDAZOLE (FLAGYL) 500 MG tablet, Take 1 tablet (500 mg total) by mouth 2 (two) times daily for 14 days., Disp: 28 tablet, Rfl: 0    No family history on file.   Social History   Tobacco Use  . Smoking status: Current Some Day Smoker  . Smokeless tobacco: Never Used  Substance Use Topics  . Alcohol use: No  . Drug use: No    Allergies as of 12/15/2017  . (No Known Allergies)    Review of Systems:    All systems reviewed and negative except where noted in HPI.   Physical Exam:  BP 113/73 (BP Location: Left Arm, Patient Position: Sitting, Cuff Size: Large)   Pulse 60   Resp 16   Ht 5\' 6"  (1.676 m)   Wt 173 lb 3.2 oz (78.6 kg)   BMI 27.96 kg/m  No LMP for male patient.  General:   Alert,  Well-developed, well-nourished, pleasant and cooperative in NAD Head:  Normocephalic and atraumatic. Eyes:  Sclera clear, no icterus.   Conjunctiva pink. Ears:  Normal auditory acuity. Nose:  No deformity, discharge, or lesions. Mouth:  No deformity or lesions,oropharynx pink & moist. Neck:  Supple; no masses or thyromegaly. Lungs:  Respirations even and unlabored.  Clear throughout to auscultation.   No wheezes, crackles, or rhonchi. No acute distress. Heart:  Regular rate and rhythm; no murmurs, clicks, rubs, or gallops. Abdomen:  Normal bowel sounds. Soft, Mild left lower quadrant tenderness and non-distended without masses, hepatosplenomegaly or hernias noted.  No guarding or rebound tenderness.   Rectal: Not  performed Msk:  Symmetrical without gross deformities. Good, equal movement & strength bilaterally. Pulses:  Normal pulses noted. Extremities:  No clubbing or edema.  No cyanosis. Neurologic:  Alert and oriented x3;  grossly normal neurologically. Skin:  Intact without significant lesions or rashes. No jaundice. Lymph Nodes:  No significant cervical adenopathy. Psych:  Alert and cooperative. Normal mood and affect.  Imaging  Studies: reviewed  Assessment and Plan:   Jaryd Drew is a 37 y.o. Spanish-speaking male with history of acute moderate sigmoid diverticulitis status post antibiotics seen at Hospital follow-up of chronic left lower quadrant pain. Differentials include recurrent diverticulitis or SCAD or less likely inflammatory bowel disease or malignancy  - recommend 2 weeks course of Cipro and Flagyl - Recommend to maintain high fiber diet, avoid red meat - Discussed about colonoscopy for further evaluation  I have discussed alternative options, risks & benefits,  which include, but are not limited to, bleeding, infection, perforation,respiratory complication & drug reaction.  The patient agrees with this plan & written consent will be obtained.     Follow up in 3 months   Arlyss Repress, MD

## 2018-01-15 ENCOUNTER — Encounter
Admission: RE | Disposition: A | Discharge status: Home / Self Care | Payer: Self-pay | Source: Ambulatory Visit | Attending: Gastroenterology

## 2018-01-15 ENCOUNTER — Encounter: Payer: Self-pay | Admitting: *Deleted

## 2018-01-15 ENCOUNTER — Ambulatory Visit
Admission: RE | Admit: 2018-01-15 | Discharge: 2018-01-15 | Disposition: A | Discharge status: Home / Self Care | Payer: Self-pay | Source: Ambulatory Visit | Attending: Gastroenterology | Admitting: Gastroenterology

## 2018-01-15 ENCOUNTER — Ambulatory Visit: Payer: Self-pay | Admitting: Certified Registered"

## 2018-01-15 DIAGNOSIS — Z8719 Personal history of other diseases of the digestive system: Secondary | ICD-10-CM | POA: Insufficient documentation

## 2018-01-15 DIAGNOSIS — K648 Other hemorrhoids: Secondary | ICD-10-CM | POA: Insufficient documentation

## 2018-01-15 DIAGNOSIS — Z09 Encounter for follow-up examination after completed treatment for conditions other than malignant neoplasm: Secondary | ICD-10-CM | POA: Insufficient documentation

## 2018-01-15 DIAGNOSIS — K5732 Diverticulitis of large intestine without perforation or abscess without bleeding: Secondary | ICD-10-CM

## 2018-01-15 DIAGNOSIS — K5730 Diverticulosis of large intestine without perforation or abscess without bleeding: Secondary | ICD-10-CM | POA: Insufficient documentation

## 2018-01-15 HISTORY — PX: COLONOSCOPY WITH PROPOFOL: SHX5780

## 2018-01-15 SURGERY — COLONOSCOPY WITH PROPOFOL
Anesthesia: General

## 2018-01-15 MED ORDER — LIDOCAINE 2% (20 MG/ML) 5 ML SYRINGE
INTRAMUSCULAR | Status: DC | PRN
  Administered 2018-01-15: 25 mg via INTRAVENOUS

## 2018-01-15 MED ORDER — MIDAZOLAM HCL 2 MG/2ML IJ SOLN
INTRAMUSCULAR | Status: AC
  Filled 2018-01-15: qty 2

## 2018-01-15 MED ORDER — SODIUM CHLORIDE 0.9 % IV SOLN
INTRAVENOUS | Status: DC
  Administered 2018-01-15: 11:00:00 via INTRAVENOUS

## 2018-01-15 MED ORDER — PROPOFOL 10 MG/ML IV BOLUS
INTRAVENOUS | Status: DC | PRN
  Administered 2018-01-15: 70 mg via INTRAVENOUS

## 2018-01-15 MED ORDER — PROPOFOL 500 MG/50ML IV EMUL
INTRAVENOUS | Status: DC | PRN
  Administered 2018-01-15: 120 ug/kg/min via INTRAVENOUS

## 2018-01-15 MED ORDER — MIDAZOLAM HCL 5 MG/5ML IJ SOLN
INTRAMUSCULAR | Status: DC | PRN
  Administered 2018-01-15: 2 mg via INTRAVENOUS

## 2018-01-15 NOTE — Anesthesia Preprocedure Evaluation (Signed)
Anesthesia Evaluation  Patient identified by MRN, date of birth, ID band Patient awake    Reviewed: Allergy & Precautions, H&P , NPO status , Patient's Chart, lab work & pertinent test results, reviewed documented beta blocker date and time   Airway Mallampati: II   Neck ROM: full    Dental  (+) Poor Dentition   Pulmonary neg pulmonary ROS, Current Smoker,    Pulmonary exam normal        Cardiovascular negative cardio ROS Normal cardiovascular exam Rhythm:regular Rate:Normal     Neuro/Psych negative neurological ROS  negative psych ROS   GI/Hepatic negative GI ROS, Neg liver ROS,   Endo/Other  negative endocrine ROS  Renal/GU negative Renal ROS  negative genitourinary   Musculoskeletal   Abdominal   Peds  Hematology negative hematology ROS (+)   Anesthesia Other Findings Past Medical History: 07/01/2017: Acute diverticulitis of intestine 01/27/2016: Scrotal pain Past Surgical History: No date: BACK SURGERY BMI    Body Mass Index:  27.97 kg/m     Reproductive/Obstetrics negative OB ROS                             Anesthesia Physical Anesthesia Plan  ASA: II  Anesthesia Plan: General   Post-op Pain Management:    Induction:   PONV Risk Score and Plan:   Airway Management Planned:   Additional Equipment:   Intra-op Plan:   Post-operative Plan:   Informed Consent: I have reviewed the patients History and Physical, chart, labs and discussed the procedure including the risks, benefits and alternatives for the proposed anesthesia with the patient or authorized representative who has indicated his/her understanding and acceptance.   Dental Advisory Given  Plan Discussed with: CRNA  Anesthesia Plan Comments:         Anesthesia Quick Evaluation

## 2018-01-15 NOTE — H&P (Signed)
Arlyss Repress, MD [address]  [address]  [place], [place] [address]  Main: [phone]  Fax: [phone] Pager: [phone]  Primary Care Physician:  Patient, No Pcp Per Primary Gastroenterologist:  Dr. Arlyss Repress  Pre-Procedure History & Physical: HPI:  Gerald Chavez is a 37 y.o. male is here for an colonoscopy.   Past Medical History:  Diagnosis Date  . Acute diverticulitis of intestine 07/01/2017  . Scrotal pain 01/27/2016    Past Surgical History:  Procedure Laterality Date  . BACK SURGERY      Prior to Admission medications   Medication Sig Start Date End Date Taking? Authorizing Provider  amitriptyline (ELAVIL) 25 MG tablet Take 1 tablet (25 mg total) by mouth at bedtime. Patient not taking: Reported on 12/15/2017 08/28/17   Riki Altes, MD    Allergies as of 12/15/2017  . (No Known Allergies)    History reviewed. No pertinent family history.  Social History   Socioeconomic History  . Marital status: Married    Spouse name: Not on file  . Number of children: Not on file  . Years of education: Not on file  . Highest education level: Not on file  Occupational History  . Not on file  Social Needs  . Financial resource strain: Not on file  . Food insecurity:    Worry: Not on file    Inability: Not on file  . Transportation needs:    Medical: Not on file    Non-medical: Not on file  Tobacco Use  . Smoking status: Never Smoker  . Smokeless tobacco: Never Used  Substance and Sexual Activity  . Alcohol use: No  . Drug use: No  . Sexual activity: Not on file  Lifestyle  . Physical activity:    Days per week: Not on file    Minutes per session: Not on file  . Stress: Not on file  Relationships  . Social connections:    Talks on phone: Not on file    Gets together: Not on file    Attends religious service: Not on file    Active member of club or organization: Not on file    Attends meetings of clubs or organizations: Not on  file    Relationship status: Not on file  . Intimate partner violence:    Fear of current or ex partner: Not on file    Emotionally abused: Not on file    Physically abused: Not on file    Forced sexual activity: Not on file  Other Topics Concern  . Not on file  Social History Narrative  . Not on file    Review of Systems: See HPI, otherwise negative ROS  Physical Exam: BP 111/76   Pulse (!) 52   Temp (!) 97.3 F (36.3 C) (Tympanic)   Resp 12   Ht 5\' 6"  (1.676 m)   Wt 78.6 kg   SpO2 100%   BMI 27.97 kg/m  General:   Alert,  pleasant and cooperative in NAD Head:  Normocephalic and atraumatic. Neck:  Supple; no masses or thyromegaly. Lungs:  Clear throughout to auscultation.    Heart:  Regular rate and rhythm. Abdomen:  Soft, nontender and nondistended. Normal bowel sounds, without guarding, and without rebound.   Neurologic:  Alert and  oriented x4;  grossly normal neurologically.  Impression/Plan: Gerald Chavez is here for an colonoscopy to be performed for f/u sigmoid diverticulitis  Risks, benefits, limitations, and alternatives regarding  colonoscopy  have been reviewed with the patient.  Questions have been answered.  All parties agreeable.   Lannette Donath, MD  01/15/2018, 10:22 AM

## 2018-01-15 NOTE — Transfer of Care (Signed)
Immediate Anesthesia Transfer of Care Note  Patient: Jairon Ardelean [facility]  Procedure(s) Performed: COLONOSCOPY WITH PROPOFOL (N/A )  Patient Location: Endoscopy Unit  Anesthesia Type:General  Level of Consciousness: awake  Airway & Oxygen Therapy: Patient Spontanous Breathing and Patient connected to nasal cannula oxygen  Post-op Assessment: Report given to RN and Post -op Vital signs reviewed and stable  Post vital signs: Reviewed  Last Vitals:  Vitals Value Taken Time  BP 96/63 01/15/2018 10:54 AM  Temp    Pulse 55 01/15/2018 10:54 AM  Resp 16 01/15/2018 10:54 AM  SpO2 100 % 01/15/2018 10:54 AM    Last Pain:  Vitals:   01/15/18 1006  TempSrc: Tympanic  PainSc: 0-No pain         Complications: No apparent anesthesia complications

## 2018-01-15 NOTE — OR Nursing (Signed)
Gerald Chavez here to interpret dc instructions into spanish for pt.and partner.

## 2018-01-15 NOTE — Anesthesia Post-op Follow-up Note (Signed)
Anesthesia QCDR form completed.        

## 2018-01-15 NOTE — OR Nursing (Signed)
Pt. Would like to talk to Dr. Allegra Lai when she is available.  Dr. Allegra Lai is in a procedure right now.

## 2018-01-15 NOTE — Op Note (Signed)
[facility] Gastroenterology Patient Name: Gerald Chavez [facility] Procedure Date: 01/15/2018 10:35 AM MRN: [id number] Account #: [id number] Date of Birth: 07-10-1980 Admit Type: Outpatient Age: 37 Room: [facility] ENDO ROOM 2 Gender: Male Note Status: Finalized Procedure:            Colonoscopy Indications:          Follow-up of diverticulitis Providers:            Lin Landsman MD, MD Referring MD:         No Local Md, MD (Referring MD) Medicines:            Monitored Anesthesia Care Complications:        No immediate complications. Estimated blood loss: None. Procedure:            Pre-Anesthesia Assessment:                       - Prior to the procedure, a History and Physical was                        performed, and patient medications and allergies were                        reviewed. The patient is competent. The risks and                        benefits of the procedure and the sedation options and                        risks were discussed with the patient. All questions                        were answered and informed consent was obtained.                        Patient identification and proposed procedure were                        verified by the physician, the nurse, the                        anesthesiologist, the anesthetist and the technician in                        the pre-procedure area in the procedure room in the                        endoscopy suite. Mental Status Examination: alert and                        oriented. Airway Examination: normal oropharyngeal                        airway and neck mobility. Respiratory Examination:                        clear to auscultation. CV Examination: normal.                        Prophylactic Antibiotics: The patient does not require  prophylactic antibiotics. Prior Anticoagulants: The                        patient has taken no previous anticoagulant or   antiplatelet agents. ASA Grade Assessment: II - A                        patient with mild systemic disease. After reviewing the                        risks and benefits, the patient was deemed in                        satisfactory condition to undergo the procedure. The                        anesthesia plan was to use monitored anesthesia care                        (MAC). Immediately prior to administration of                        medications, the patient was re-assessed for adequacy                        to receive sedatives. The heart rate, respiratory rate,                        oxygen saturations, blood pressure, adequacy of                        pulmonary ventilation, and response to care were                        monitored throughout the procedure. The physical status                        of the patient was re-assessed after the procedure.                       After obtaining informed consent, the colonoscope was                        passed under direct vision. Throughout the procedure,                        the patient's blood pressure, pulse, and oxygen                        saturations were monitored continuously. The                        Colonoscope was introduced through the anus and                        advanced to the the terminal ileum. The colonoscopy was                        performed without difficulty. The patient tolerated the  procedure well. The quality of the bowel preparation                        was evaluated using the BBPS [facility] Bowel Preparation                        Scale) with scores of: Right Colon = 3, Transverse                        Colon = 3 and Left Colon = 3 (entire mucosa seen well                        with no residual staining, small fragments of stool or                        opaque liquid). The total BBPS score equals 9. Findings:      The perianal and digital rectal examinations were normal.  Pertinent       negatives include normal sphincter tone and no palpable rectal lesions.      Multiple small-mouthed diverticula were found in the sigmoid colon.       There was no evidence of diverticular bleeding.      Non-bleeding external and internal hemorrhoids were found during       retroflexion. The hemorrhoids were medium-sized.      The terminal ileum appeared normal.      Normal mucosa was found in the entire colon. Impression:           - Severe diverticulosis in the sigmoid colon. There was                        no evidence of diverticular bleeding.                       - Non-bleeding external and internal hemorrhoids.                       - The examined portion of the ileum was normal.                       - Normal mucosa in the entire examined colon.                       - No specimens collected. Recommendation:       - Discharge patient to home (with escort).                       - Resume regular diet today.                       - Continue present medications.                       - Repeat colonoscopy at age 4 for screening purposes. Procedure Code(s):    --- Professional ---                       [id number], Colonoscopy, flexible; diagnostic, including                        collection of specimen(s) by brushing or  washing, when                        performed (separate procedure) Diagnosis Code(s):    --- Professional ---                       K64.8, Other hemorrhoids                       K57.32, Diverticulitis of large intestine without                        perforation or abscess without bleeding                       K57.30, Diverticulosis of large intestine without                        perforation or abscess without bleeding CPT copyright 2017 American Medical Association. All rights reserved. The codes documented in this report are preliminary and upon coder review may  be revised to meet current compliance requirements. Dr. Ulyess Mort Lin Landsman MD, MD 01/15/2018 10:51:05 AM This report has been signed electronically. Number of Addenda: 0 Note Initiated On: 01/15/2018 10:35 AM Scope Withdrawal Time: 0 hours 5 minutes 46 seconds  Total Procedure Duration: 0 hours 7 minutes 36 seconds       [facility]

## 2018-01-16 ENCOUNTER — Encounter: Payer: Self-pay | Admitting: Gastroenterology

## 2018-01-23 NOTE — Anesthesia Postprocedure Evaluation (Signed)
Anesthesia Post Note  Patient: Gerald Chavez  Procedure(s) Performed: COLONOSCOPY WITH PROPOFOL (N/A )  Patient location during evaluation: PACU Anesthesia Type: General Level of consciousness: awake and alert Pain management: pain level controlled Vital Signs Assessment: post-procedure vital signs reviewed and stable Respiratory status: spontaneous breathing, nonlabored ventilation, respiratory function stable and patient connected to nasal cannula oxygen Cardiovascular status: blood pressure returned to baseline and stable Postop Assessment: no apparent nausea or vomiting Anesthetic complications: no     Last Vitals:  Vitals:   01/15/18 1112 01/15/18 1132  BP: 109/72 109/79  Pulse: (!) 56 (!) 52  Resp: 17 14  Temp:    SpO2: 100% 100%    Last Pain:  Vitals:   01/15/18 1132  TempSrc:   PainSc: 2                  Yevette EdwardsJames G Ashlie Mcmenamy

## 2018-03-19 ENCOUNTER — Ambulatory Visit: Payer: Self-pay | Admitting: Gastroenterology

## 2021-08-28 ENCOUNTER — Emergency Department: Payer: Self-pay

## 2021-08-28 ENCOUNTER — Other Ambulatory Visit: Payer: Self-pay

## 2021-08-28 ENCOUNTER — Emergency Department
Admission: EM | Admit: 2021-08-28 | Discharge: 2021-08-28 | Disposition: A | Discharge status: Home / Self Care | Payer: Self-pay | Attending: Emergency Medicine | Admitting: Emergency Medicine

## 2021-08-28 DIAGNOSIS — K5732 Diverticulitis of large intestine without perforation or abscess without bleeding: Secondary | ICD-10-CM | POA: Insufficient documentation

## 2021-08-28 DIAGNOSIS — D72829 Elevated white blood cell count, unspecified: Secondary | ICD-10-CM | POA: Insufficient documentation

## 2021-08-28 DIAGNOSIS — K5792 Diverticulitis of intestine, part unspecified, without perforation or abscess without bleeding: Secondary | ICD-10-CM

## 2021-08-28 LAB — CBC
HCT: 45.6 % (ref 39.0–52.0)
Hemoglobin: 15.2 g/dL (ref 13.0–17.0)
MCH: 28.8 pg (ref 26.0–34.0)
MCHC: 33.3 g/dL (ref 30.0–36.0)
MCV: 86.4 fL (ref 80.0–100.0)
Platelets: 260 10*3/uL (ref 150–400)
RBC: 5.28 MIL/uL (ref 4.22–5.81)
RDW: 13.1 % (ref 11.5–15.5)
WBC: 12.3 10*3/uL — ABNORMAL HIGH (ref 4.0–10.5)
nRBC: 0 % (ref 0.0–0.2)

## 2021-08-28 LAB — URINALYSIS, ROUTINE W REFLEX MICROSCOPIC
Bilirubin Urine: NEGATIVE
Glucose, UA: NEGATIVE mg/dL
Hgb urine dipstick: NEGATIVE
Ketones, ur: NEGATIVE mg/dL
Leukocytes,Ua: NEGATIVE
Nitrite: NEGATIVE
Protein, ur: NEGATIVE mg/dL
Specific Gravity, Urine: 1.016 (ref 1.005–1.030)
pH: 5 (ref 5.0–8.0)

## 2021-08-28 LAB — COMPREHENSIVE METABOLIC PANEL
ALT: 37 U/L (ref 0–44)
AST: 25 U/L (ref 15–41)
Albumin: 4.5 g/dL (ref 3.5–5.0)
Alkaline Phosphatase: 54 U/L (ref 38–126)
Anion gap: 8 (ref 5–15)
BUN: 13 mg/dL (ref 6–20)
CO2: 25 mmol/L (ref 22–32)
Calcium: 9.2 mg/dL (ref 8.9–10.3)
Chloride: 104 mmol/L (ref 98–111)
Creatinine, Ser: 0.88 mg/dL (ref 0.61–1.24)
GFR, Estimated: >60 mL/min (ref >60)
Glucose, Bld: 93 mg/dL (ref 70–99)
Potassium: 4 mmol/L (ref 3.5–5.1)
Sodium: 137 mmol/L (ref 135–145)
Total Bilirubin: 1.2 mg/dL (ref 0.3–1.2)
Total Protein: 8.3 g/dL — ABNORMAL HIGH (ref 6.5–8.1)

## 2021-08-28 LAB — LIPASE, BLOOD: Lipase: 33 U/L (ref 11–51)

## 2021-08-28 MED ORDER — SODIUM CHLORIDE 0.9 % IV BOLUS
1000.0000 mL | Freq: Once | INTRAVENOUS | Status: AC
Start: 2021-08-28 — End: 2021-08-28
  Administered 2021-08-28: 1000 mL via INTRAVENOUS

## 2021-08-28 MED ORDER — OXYCODONE-ACETAMINOPHEN 5-325 MG PO TABS
1.0000 | ORAL_TABLET | Freq: Once | ORAL | Status: AC
  Administered 2021-08-28: 1 via ORAL
  Filled 2021-08-28: qty 1

## 2021-08-28 MED ORDER — DICYCLOMINE HCL 10 MG PO CAPS: 10.0000 mg | ORAL_CAPSULE | Freq: Three times a day (TID) | ORAL | 0 refills | Status: AC | PRN

## 2021-08-28 MED ORDER — MORPHINE SULFATE (PF) 4 MG/ML IV SOLN
4.0000 mg | Freq: Once | INTRAVENOUS | Status: AC
Start: 2021-08-28 — End: 2021-08-28
  Administered 2021-08-28: 4 mg via INTRAVENOUS
  Filled 2021-08-28: qty 1

## 2021-08-28 MED ORDER — TRAMADOL HCL 50 MG PO TABS: 50.0000 mg | ORAL_TABLET | Freq: Four times a day (QID) | ORAL | 0 refills | Status: AC | PRN

## 2021-08-28 MED ORDER — AMOXICILLIN-POT CLAVULANATE 875-125 MG PO TABS
1.0000 | ORAL_TABLET | Freq: Two times a day (BID) | ORAL | 0 refills | Status: AC
Start: 2021-08-28 — End: 2021-09-07

## 2021-08-28 MED ORDER — DICYCLOMINE HCL 10 MG PO CAPS
20.0000 mg | ORAL_CAPSULE | Freq: Once | ORAL | Status: AC
  Administered 2021-08-28: 20 mg via ORAL
  Filled 2021-08-28: qty 2

## 2021-08-28 MED ORDER — IOHEXOL 300 MG/ML  SOLN
100.0000 mL | Freq: Once | INTRAMUSCULAR | Status: AC | PRN
  Administered 2021-08-28: 100 mL via INTRAVENOUS

## 2021-08-28 MED ORDER — ONDANSETRON HCL 4 MG/2ML IJ SOLN
4.0000 mg | Freq: Once | INTRAMUSCULAR | Status: AC
  Administered 2021-08-28: 4 mg via INTRAVENOUS
  Filled 2021-08-28: qty 2

## 2021-08-28 NOTE — ED Provider Notes (Signed)
? ?[facility] ?Provider Note ? ? ? Event Date/Time  ? First MD Initiated Contact with Patient 08/28/21 1504   ?  (approximate) ? ? ?History  ? ?Abdominal Pain ? ? ?HPI ? ?Gerald Chavez is a 41 y.o. male  who, per discharge summary dated 07/04/2017 had admission for diverticulitis, who presents to the emergency department today because of concerns for abdominal pain.  He states it started 3 days ago.  Initially it was in the lower abdomen although now it feels like it is also in his upper abdomen.  It has been constant since it started.  It is worsened with movement and eating.  The patient states that it does remind him from point he has had diverticulitis in the past.  He denies any change in his bowel movements.  Denies any change in his urine.  Denies any fevers. ? ? ?Physical Exam  ? ?Triage Vital Signs: ?ED Triage Vitals [08/28/21 1451]  ?Enc Vitals Group  ?   BP (!) 138/106  ?   Pulse Rate 67  ?   Resp 18  ?   Temp 97.8 ?F (36.6 ?C)  ?   Temp src   ?   SpO2 100 %  ?   Weight   ?   Height   ?   Head Circumference   ?   Peak Flow   ?   Pain Score 8  ? ?Most recent vital signs: ?Vitals:  ? 08/28/21 1451  ?BP: (!) 138/106  ?Pulse: 67  ?Resp: 18  ?Temp: 97.8 ?F (36.6 ?C)  ?SpO2: 100%  ? ? ?General: Awake, alert, not oriented. ?CV:  Good peripheral perfusion. Regular rate and rhythm. ?Resp:  Normal effort. Lungs clear. ?Abd:  No distention. Tender to palpation.  ? ? ?ED Results / Procedures / Treatments  ? ?Labs ?(all labs ordered are listed, but only abnormal results are displayed) ?Labs Reviewed  ?COMPREHENSIVE METABOLIC PANEL - Abnormal; Notable for the following components:  ?    Result Value  ? Total Protein 8.3 (*)   ? All other components within normal limits  ?CBC - Abnormal; Notable for the following components:  ? WBC 12.3 (*)   ? All other components within normal limits  ?URINALYSIS, ROUTINE W REFLEX MICROSCOPIC - Abnormal; Notable for the following components:  ? Color, Urine  YELLOW (*)   ? APPearance CLEAR (*)   ? All other components within normal limits  ?LIPASE, BLOOD  ? ? ? ? ? ?RADIOLOGY ?I independently interpreted and visualized the ct abd/pel. My interpretation: diverticulitis, no free air ?Radiology interpretation:  ?  ?IMPRESSION:  ?Acute on chronic sigmoid diverticulitis. No evidence of abscess  ?formation.  ?   ?Likely reactive mucosal thickening and dilation of the proximal  ?rectum.  ? ?PROCEDURES: ? ?Critical Care performed: No ? ?Procedures ? ? ?MEDICATIONS ORDERED IN ED: ?Medications - No data to display ? ? ?IMPRESSION / MDM / ASSESSMENT AND PLAN / ED COURSE  ?I reviewed the triage vital signs and the nursing notes. ?             ?               ? ?Differential diagnosis includes, but is not limited to, appendicitis, diverticulitis, gastroenteritis. ? ?Patient presents to the emergency department today because of concerns for abdominal pain.  Patient does have a history of diverticulitis.  Slight leukocytosis on blood work.  Afebrile.  CT scan was obtained which  is consistent with diverticulitis.  No evidence of abscess or perforation.  At this time given CT findings and patient did feel better after pain medication I think is reasonable for patient be discharged home and I do not feel he necessitates inpatient mission at this time.  Will give patient GI and primary care follow-up.  Discussed return precautions. ? ? ? ?FINAL CLINICAL IMPRESSION(S) / ED DIAGNOSES  ? ?Final diagnoses:  ?Diverticulitis  ? ? ? ?Note:  This document was prepared using Dragon voice recognition software and may include unintentional dictation errors. ? ?  ?Phineas Semen, MD ?08/28/21 1718 ? ?

## 2021-08-28 NOTE — ED Triage Notes (Signed)
Pt comes with c/o lower belly pain 4 days ago. Pt states it is now all over. Pt states back pain that started last night. ? ?Pt states pain after eating and feels his belly gets swollen. Pt denies any urinary symptoms. Pt denies any N/V/D. ?

## 2021-08-28 NOTE — Discharge Instructions (Addendum)
Please be sure to establish care with a primary care physician. Additionally please be sure to follow up with the GI doctors so they can evaluate you for the diverticulitis and findings on CT. Please seek medical attention for any high fevers, chest pain, shortness of breath, change in behavior, persistent vomiting, bloody stool or any other new or concerning symptoms. ? ?

## 2024-04-06 ENCOUNTER — Other Ambulatory Visit: Payer: Self-pay

## 2024-04-06 ENCOUNTER — Emergency Department
Admission: EM | Admit: 2024-04-06 | Discharge: 2024-04-06 | Disposition: A | Discharge status: Home / Self Care | Payer: Self-pay | Attending: Emergency Medicine | Admitting: Emergency Medicine

## 2024-04-06 DIAGNOSIS — T7840XA Allergy, unspecified, initial encounter: Secondary | ICD-10-CM

## 2024-04-06 DIAGNOSIS — T782XXA Anaphylactic shock, unspecified, initial encounter: Secondary | ICD-10-CM | POA: Insufficient documentation

## 2024-04-06 MED ORDER — PREDNISONE 20 MG PO TABS: 40.0000 mg | ORAL_TABLET | Freq: Every day | ORAL | 0 refills | Status: AC

## 2024-04-06 MED ORDER — SODIUM CHLORIDE 0.9 % IV BOLUS
1000.0000 mL | Freq: Once | INTRAVENOUS | Status: AC
  Administered 2024-04-06: 1000 mL via INTRAVENOUS

## 2024-04-06 MED ORDER — EPINEPHRINE 0.3 MG/0.3ML IJ SOAJ: 0.3000 mg | Freq: Once | INTRAMUSCULAR | Status: AC

## 2024-04-06 MED ORDER — METHYLPREDNISOLONE SODIUM SUCC 125 MG IJ SOLR
125.0000 mg | Freq: Once | INTRAMUSCULAR | Status: AC
  Administered 2024-04-06: 125 mg via INTRAVENOUS
  Filled 2024-04-06: qty 2

## 2024-04-06 MED ORDER — FAMOTIDINE IN NACL 20-0.9 MG/50ML-% IV SOLN
20.0000 mg | Freq: Once | INTRAVENOUS | Status: AC
  Administered 2024-04-06: 20 mg via INTRAVENOUS
  Filled 2024-04-06: qty 50

## 2024-04-06 MED ORDER — EPINEPHRINE 0.3 MG/0.3ML IJ SOAJ
INTRAMUSCULAR | Status: AC
Start: 2024-04-06 — End: 2024-04-06
  Administered 2024-04-06: 0.3 mg via INTRAMUSCULAR
  Filled 2024-04-06: qty 0.3

## 2024-04-06 MED ORDER — DIPHENHYDRAMINE HCL 50 MG/ML IJ SOLN
50.0000 mg | Freq: Once | INTRAMUSCULAR | Status: AC
  Administered 2024-04-06: 50 mg via INTRAVENOUS
  Filled 2024-04-06: qty 1

## 2024-04-06 MED ORDER — EPINEPHRINE 0.3 MG/0.3ML IJ SOAJ: 0.3000 mg | INTRAMUSCULAR | 1 refills | Status: AC | PRN

## 2024-04-06 NOTE — Discharge Instructions (Addendum)
 Please take prednisone starting tomorrow 04/07/2024 each morning for the next 5 days.  Please use your EpiPen if you have any difficulty breathing severe hives or swelling of your mouth tongue or lips.  Please return immediately to the emergency department if you ever need to use your EpiPen.  I have also put in a primary care doctor for referral for you, please make sure to follow-up.

## 2024-04-06 NOTE — ED Triage Notes (Signed)
 First nurse note: Pt to ED via POV. On arrival to lobby pt with hives/redness noted all over body with labored breathing. Pt taken to room 18 for possible allergic rxn

## 2024-04-06 NOTE — ED Notes (Signed)
 Pt ambulatory to restroom

## 2024-04-06 NOTE — ED Provider Notes (Signed)
.-----------------------------------------   3:13 PM on 04/06/2024 -----------------------------------------  Blood pressure 114/73, pulse 77, temperature (!) 96 F (35.6 C), temperature source Axillary, resp. rate 19, height 5' 6 (1.676 m), weight 85.7 kg, SpO2 99%.  Assuming care from Dr. Dorothyann.  In short, Gerald Chavez is a 43 y.o. male with a chief complaint of allergic reaction.  Refer to the original H&P for additional details.  The current plan of care is to observe through 5.  If symptoms improve, able to be discharged with EpiPen.  Was observed in the emergency department, asymptomatic on reassessment.  Discussed with him about EpiPen and prednisone prescriptions that was sent to his pharmacy.  Considered but no indication for inpatient admission at this time, he safe for outpatient management.  Will discharge with strict return precautions.  Also placed a primary care doctor referral.     Medications  EPINEPHrine (EPI-PEN) injection 0.3 mg (0.3 mg Intramuscular Given 04/06/24 1400)  methylPREDNISolone sodium succinate (SOLU-MEDROL) 125 mg/2 mL injection 125 mg (125 mg Intravenous Given 04/06/24 1413)  diphenhydrAMINE (BENADRYL) injection 50 mg (50 mg Intravenous Given 04/06/24 1412)  famotidine (PEPCID) IVPB 20 mg premix (0 mg Intravenous Stopped 04/06/24 1455)  sodium chloride  0.9 % bolus 1,000 mL (0 mLs Intravenous Stopped 04/06/24 1500)     ED Discharge Orders          Ordered    EPINEPHrine (EPIPEN 2-PAK) 0.3 mg/0.3 mL IJ SOAJ injection  As needed        04/06/24 1524    predniSONE (DELTASONE) 20 MG tablet  Daily with breakfast        04/06/24 1524    Ambulatory Referral to Primary Care (Establish Care)        04/06/24 1751           Final diagnoses:  Allergic reaction, initial encounter      Waymond Lorelle Cummins, MD 04/06/24 1751

## 2024-04-06 NOTE — ED Triage Notes (Signed)
 Pt to ED for possible allergic reaction, pt unaware of any allergies he may have. Pt denies any CP but states is difficult to breathe. This started about an hour ago when pt took advil  for pain. 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 Pt states throat hurts and is hard to breathe.

## 2024-04-06 NOTE — ED Notes (Signed)
 This RN using  tablet interpretor went over DC instructions with pt. Pt stating no questions at this time. DC home with family.

## 2024-04-06 NOTE — ED Provider Notes (Signed)
 [facility] Provider Note    Event Date/Time   First MD Initiated Contact with Patient 04/06/24 1357     (approximate)  History   Chief Complaint: Allergic Reaction  HPI  Gerald Chavez is a 43 y.o. male with no significant past medical history presents to the emergency department for an allergic reaction.  According to the patient he ate lunch today and then had an ibuprofen  for headache.  He states shortly after that he developed diffuse hives and itching.  Upon arrival patient is diffusely erythematous with hives throughout the body.  He is able to speak.  Denies any swelling of the mouth or tongue.  No difficulty breathing.  Patient states he feels weak.  Blood pressure currently 85/60.  Patient states he has had similar events in the past but they have never been able to figure out what the reaction is to.  Denies any new exposures today.  Takes ibuprofen  fairly frequently.  Physical Exam   Triage Vital Signs: ED Triage Vitals [04/06/24 1358]  Encounter Vitals Group     BP      Girls Systolic BP Percentile      Girls Diastolic BP Percentile      Boys Systolic BP Percentile      Boys Diastolic BP Percentile      Pulse      Resp      Temp      Temp src      SpO2      Weight 189 lb (85.7 kg)     Height 5' 6 (1.676 m)     Head Circumference      Peak Flow      Pain Score      Pain Loc      Pain Education      Exclude from Growth Chart     Most recent vital signs: There were no vitals filed for this visit.  General: Awake, no distress.  CV:  Good peripheral perfusion.  Regular rate and rhythm  Resp:  Normal effort.  Equal breath sounds bilaterally.  Abd:  No distention.  Soft, nontender.   Other:  Patient is diffusely erythematous with patches of urticaria/hives throughout.  No oral edema noted.  Breath sounds are clear bilaterally.   ED Results / Procedures / Treatments   EKG  EKG viewed and interpreted by myself shows normal  sinus rhythm at 60 bpm with a narrow QRS, normal axis, normal intervals, no concerning ST changes.  MEDICATIONS ORDERED IN ED: Medications  methylPREDNISolone sodium succinate (SOLU-MEDROL) 125 mg/2 mL injection 125 mg (has no administration in time range)  diphenhydrAMINE (BENADRYL) injection 50 mg (has no administration in time range)  famotidine (PEPCID) IVPB 20 mg premix (has no administration in time range)  sodium chloride  0.9 % bolus 1,000 mL (has no administration in time range)  EPINEPHrine (EPI-PEN) injection 0.3 mg (0.3 mg Intramuscular Given 04/06/24 1400)     IMPRESSION / MDM / ASSESSMENT AND PLAN / ED COURSE  I reviewed the triage vital signs and the nursing notes.  Patient's presentation is most consistent with acute presentation with potential threat to life or bodily function.  Patient presents emergency department for an allergic reaction.  Patient with diffuse hives/erythema throughout.  Blood pressure currently in the 80s.  We will dose an EpiPen we will dose IV fluids 50 mg of IV Benadryl, 20 mg of IV Pepcid and 125 mg of IV Solu-Medrol.  Will continue to closely  monitor in the emergency department.  No oral edema, clear lung sounds bilaterally.  Patient states he has had similar events in the past and has seen an allergist and they have not been able to figure out why this is happening.  Patient is feeling better.  Patient care signed out to oncoming provider for reassessment.  CRITICAL CARE Performed by: Franky Moores   Total critical care time: 30 minutes  Critical care time was exclusive of separately billable procedures and treating other patients.  Critical care was necessary to treat or prevent imminent or life-threatening deterioration.  Critical care was time spent personally by me on the following activities: development of treatment plan with patient and/or surrogate as well as nursing, discussions with consultants, evaluation of patient's response  to treatment, examination of patient, obtaining history from patient or surrogate, ordering and performing treatments and interventions, ordering and review of laboratory studies, ordering and review of radiographic studies, pulse oximetry and re-evaluation of patient's condition.   FINAL CLINICAL IMPRESSION(S) / ED DIAGNOSES   Allergic reaction Anaphylaxis    Note:  This document was prepared using Dragon voice recognition software and may include unintentional dictation errors.   Moores Franky, MD 04/06/24 1524
